# Patient Record
Sex: Male | Born: 1935 | Race: White | Hispanic: No | Marital: Single | Smoking: Never smoker
Health system: Southern US, Community
[De-identification: ages and names within clinical notes are randomized; demographics above are authoritative.]

## PROBLEM LIST (undated history)

## (undated) DIAGNOSIS — E119 Type 2 diabetes mellitus without complications: Secondary | ICD-10-CM

## (undated) DIAGNOSIS — H9190 Unspecified hearing loss, unspecified ear: Secondary | ICD-10-CM

## (undated) DIAGNOSIS — B029 Zoster without complications: Secondary | ICD-10-CM

## (undated) DIAGNOSIS — I10 Essential (primary) hypertension: Secondary | ICD-10-CM

## (undated) DIAGNOSIS — E7800 Pure hypercholesterolemia, unspecified: Secondary | ICD-10-CM

## (undated) HISTORY — PX: CARDIAC SURGERY: SHX584

## (undated) HISTORY — PX: CARDIAC CATHETERIZATION: SHX172

## (undated) HISTORY — PX: VEIN LIGATION AND STRIPPING: SHX2653

---

## 2007-01-20 HISTORY — PX: CORONARY ARTERY BYPASS GRAFT: SHX141

## 2007-07-13 ENCOUNTER — Inpatient Hospital Stay: Payer: Self-pay | Admitting: Internal Medicine

## 2007-07-13 ENCOUNTER — Other Ambulatory Visit: Payer: Self-pay

## 2007-07-14 ENCOUNTER — Other Ambulatory Visit: Payer: Self-pay

## 2007-08-03 ENCOUNTER — Encounter: Payer: Self-pay | Admitting: Internal Medicine

## 2007-08-20 ENCOUNTER — Encounter: Payer: Self-pay | Admitting: Internal Medicine

## 2007-10-05 ENCOUNTER — Encounter: Payer: Self-pay | Admitting: Cardiology

## 2007-10-20 ENCOUNTER — Encounter: Payer: Self-pay | Admitting: Cardiology

## 2007-11-20 ENCOUNTER — Encounter: Payer: Self-pay | Admitting: Cardiology

## 2007-12-20 ENCOUNTER — Encounter: Payer: Self-pay | Admitting: Cardiology

## 2009-02-17 IMAGING — CR DG CHEST 1V PORT
1 series · 1 of 1 positions shown · non-contrast
Comparison: none

REASON FOR EXAM: chest pain
COMMENTS:

PROCEDURE:     DXR - DXR PORTABLE CHEST SINGLE VIEW  - July 13, 2007 [DATE]
RESULT:     The lung fields are clear. No pneumonia, pneumothorax or pleural
effusion is seen. The heart size is within the limits of normal.  There is a
mild thoracolumbar scoliosis.

[view not recorded]
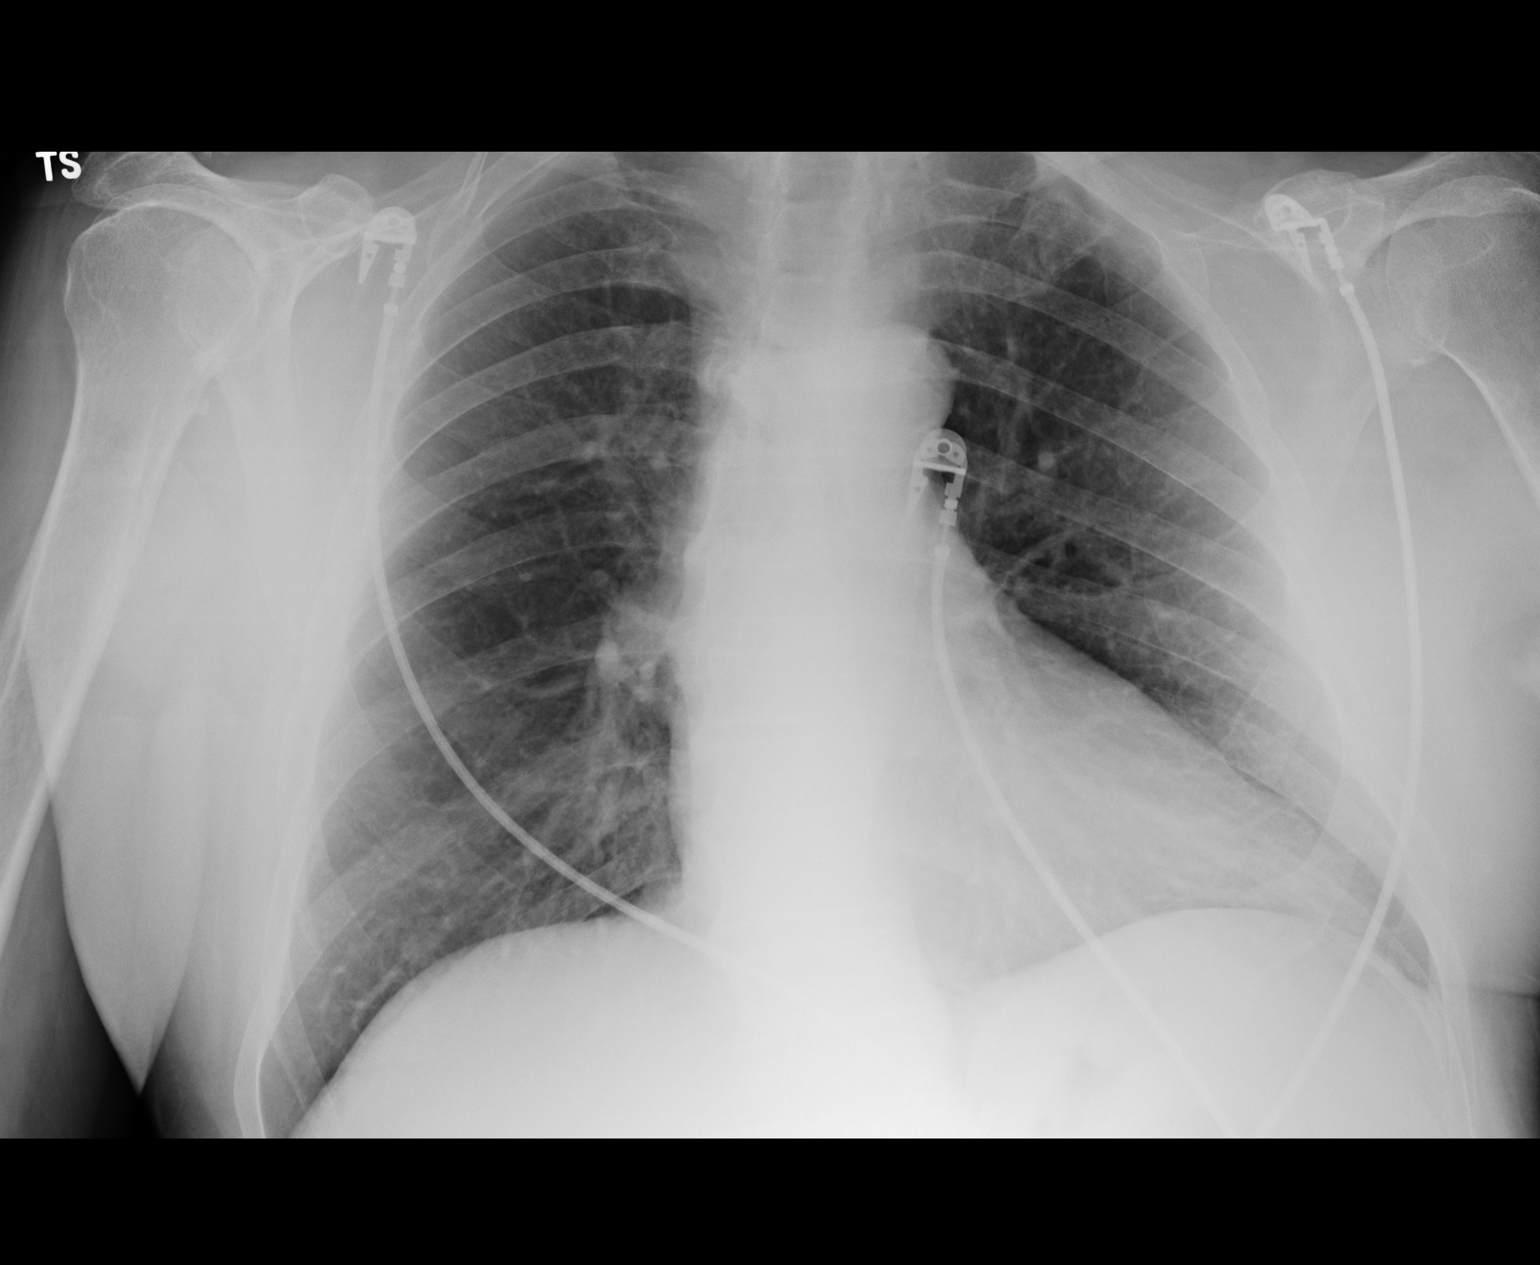

[1 of 1 positions shown; findings below may reference images not displayed]

IMPRESSION: No acute changes are identified.

## 2009-02-19 IMAGING — US US CAROTID DUPLEX BILAT
1 series · 17 of 24 positions shown · non-contrast
Comparison: none

REASON FOR EXAM: precabg
COMMENTS:

[Series 1: us carotid duplex bilat · 17 of 62 slices shown]
[im 1/62]
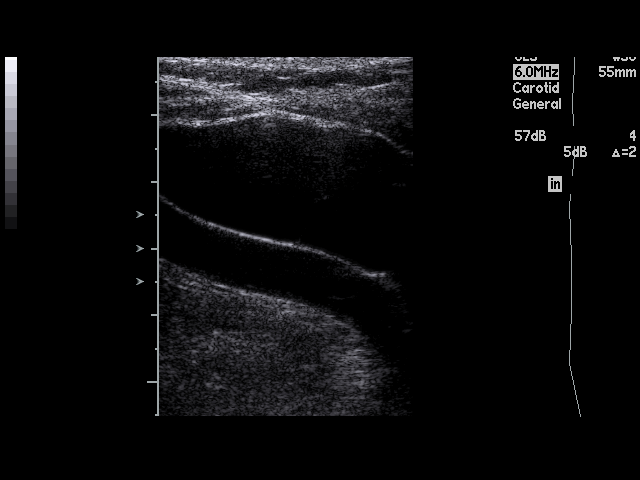
[im 6/62]
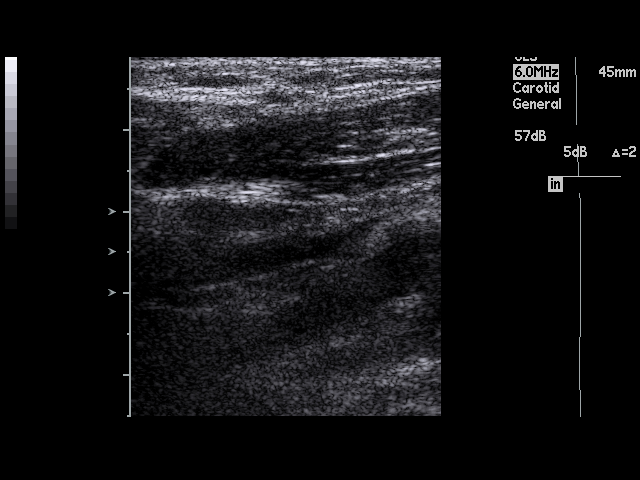
[im 8/62]
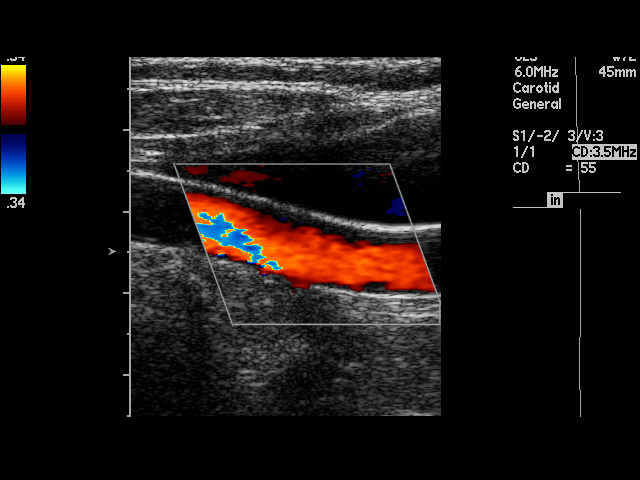
[im 11/62]
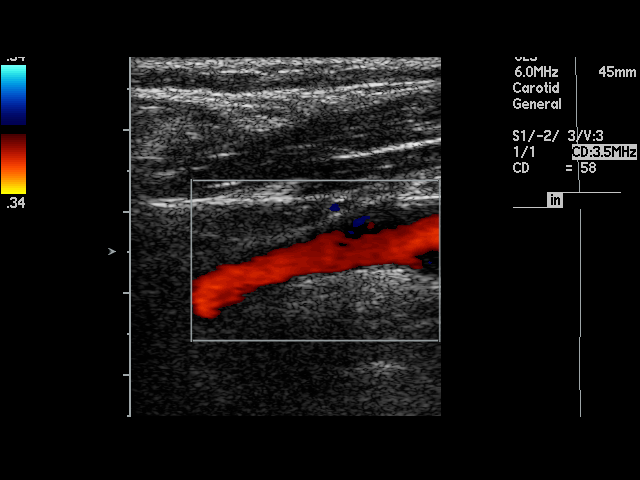
[im 16/62]
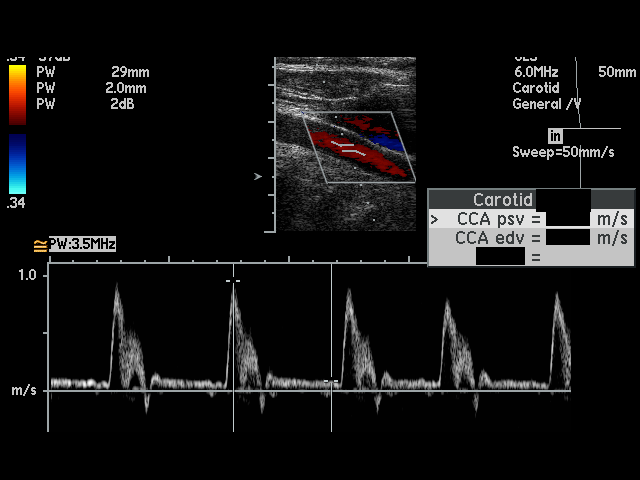
[im 19/62]
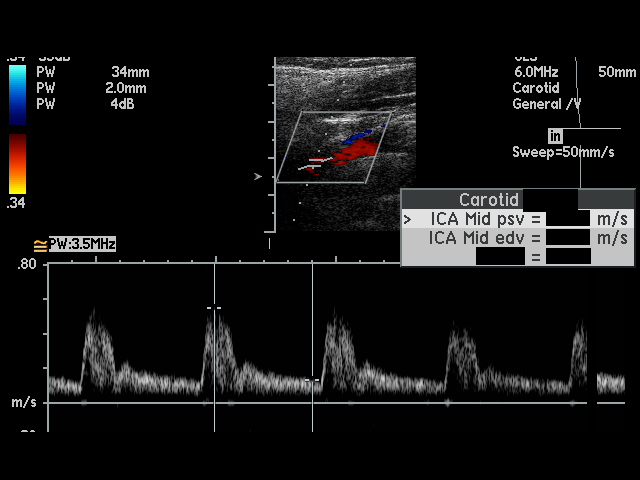
[im 24/62]
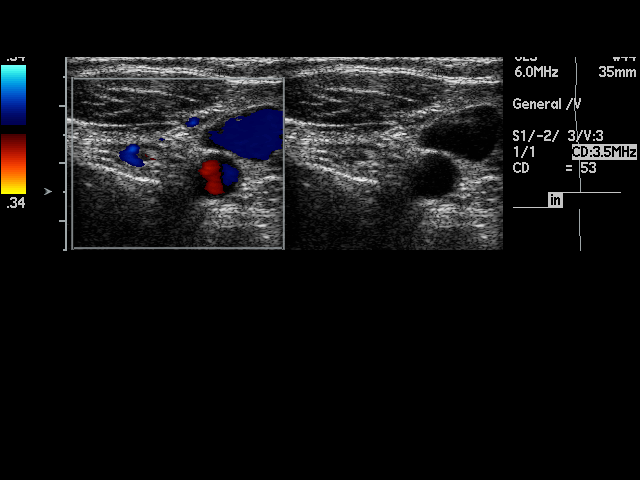
[im 27/62]
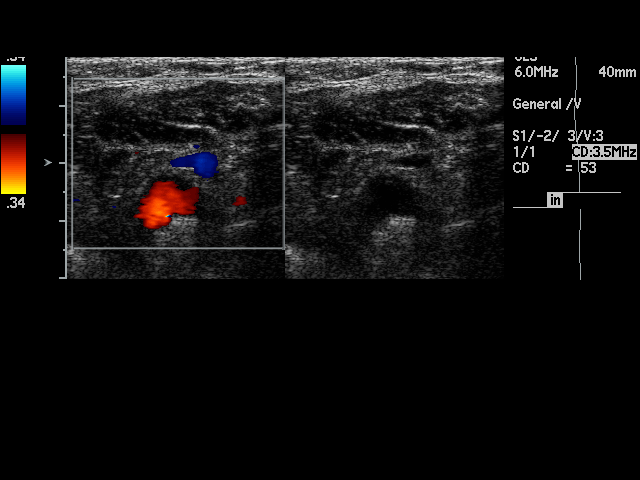
[im 32/62]
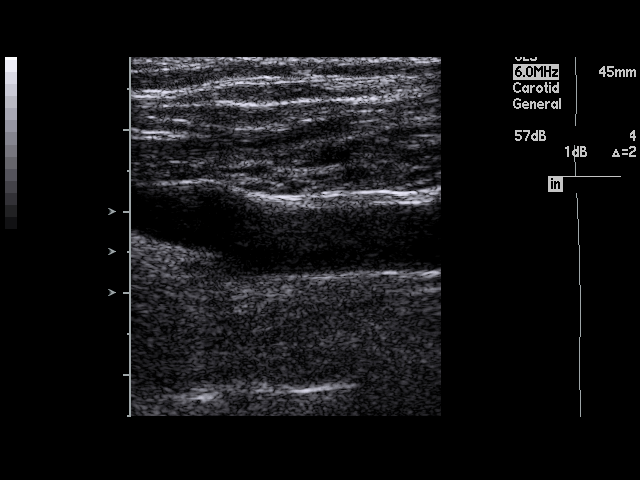
[im 35/62]
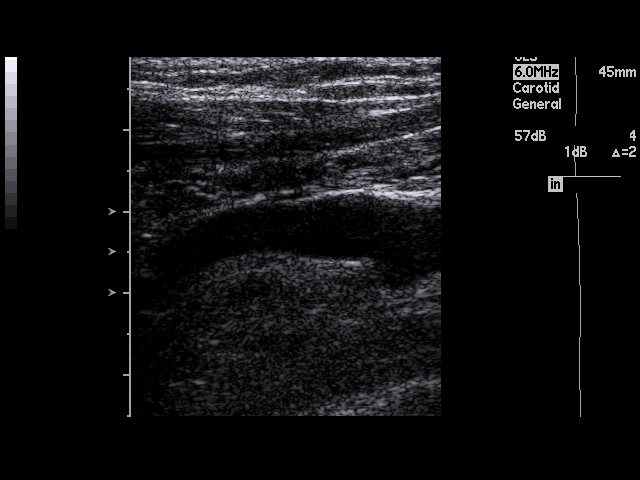
[im 38/62]
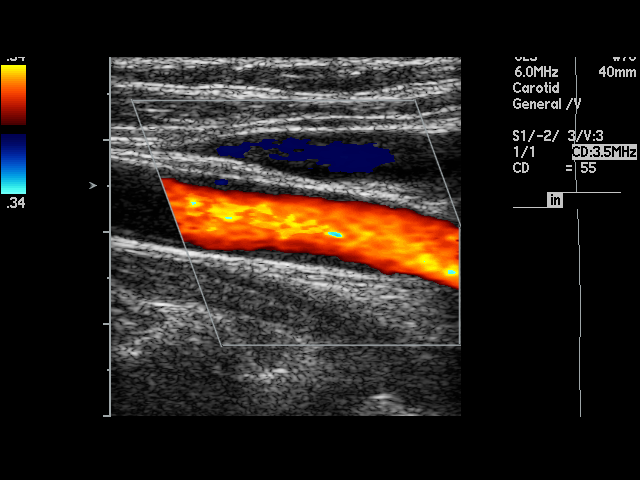
[im 43/62]
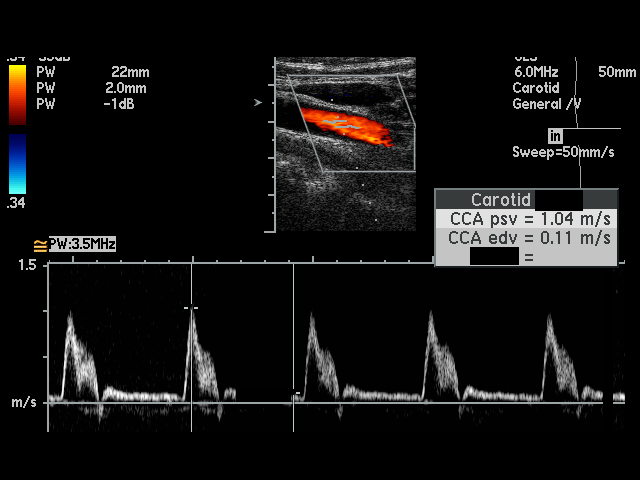
[im 46/62]
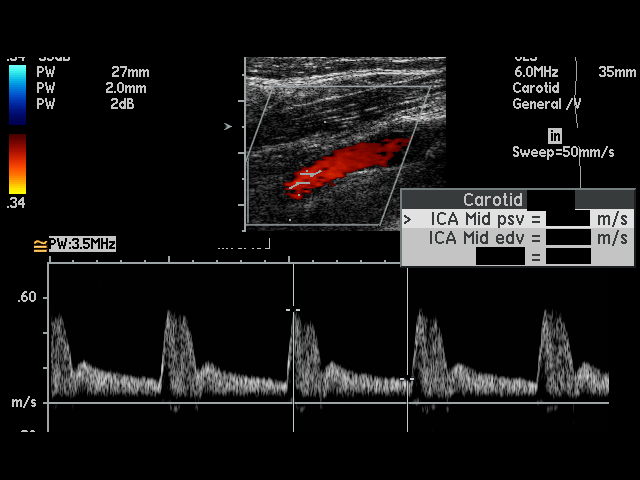
[im 51/62]
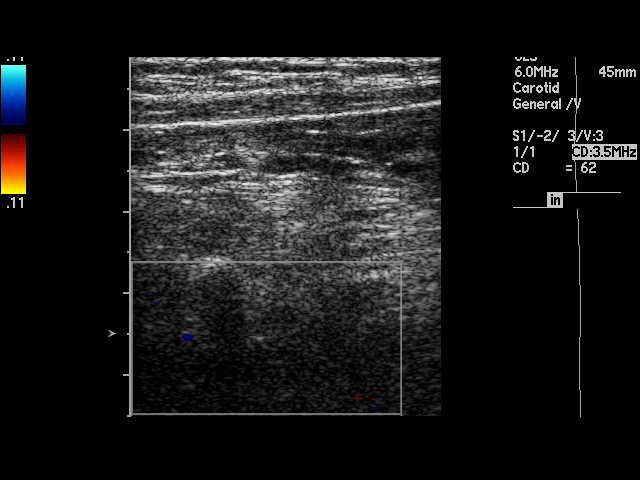
[im 54/62]
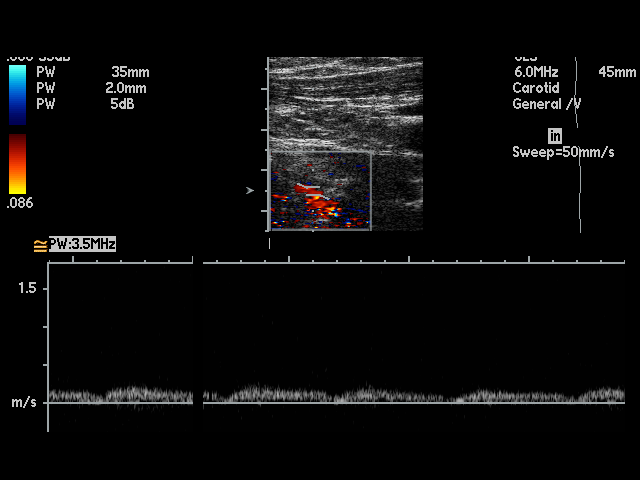
[im 56/62]
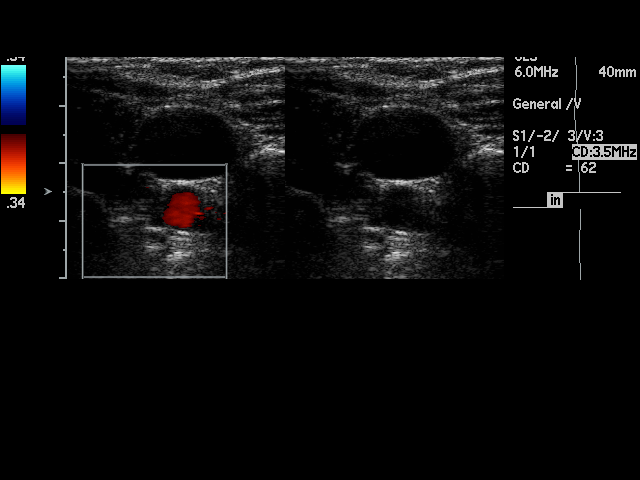
[im 62/62]
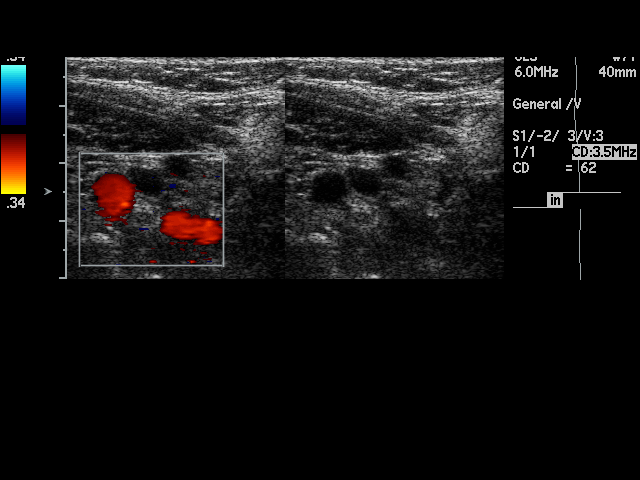

[17 of 24 positions shown; findings below may reference images not displayed]

PROCEDURE:     US  - US CAROTID DOPPLER BILATERAL  - July 15, 2007  [DATE]

RESULT:     No definite plaque formation is identified on either side. On
the RIGHT, the peak RIGHT common carotid artery flow velocity measures .964
meters per second and the peak RIGHT internal carotid artery flow velocity
measures 0.697 meters per second. The ICA/CCA ratio is 0.723. On the LEFT,
the peak LEFT common carotid artery flow velocity measures 1.04 meters per
second and the peak LEFT internal carotid artery flow velocity measures
0.534 meters per second. The ICA/CCA ratio is 0.513.  These values
bilaterally are in the normal range and are consistent with the bilateral
absence of hemodynamically significant stenosis.

There is observed antegrade flow in the RIGHT vertebral. The LEFT vertebral
is not seen and apparently is occluded or hypoplastic.
IMPRESSION: 1. No plaque formation or stenosis is identified on either side.
2. There is antegrade flow in the RIGHT vertebral.
3. The LEFT vertebral is not seen and apparently is occluded or hypoplastic.

## 2013-05-24 DIAGNOSIS — I25810 Atherosclerosis of coronary artery bypass graft(s) without angina pectoris: Secondary | ICD-10-CM | POA: Insufficient documentation

## 2018-09-30 ENCOUNTER — Emergency Department
Admission: EM | Admit: 2018-09-30 | Discharge: 2018-09-30 | Disposition: A | Discharge status: Home / Self Care | Payer: Medicare Other | Attending: Student in an Organized Health Care Education/Training Program | Admitting: Student in an Organized Health Care Education/Training Program

## 2018-09-30 ENCOUNTER — Other Ambulatory Visit: Payer: Self-pay

## 2018-09-30 DIAGNOSIS — Z79899 Other long term (current) drug therapy: Secondary | ICD-10-CM | POA: Insufficient documentation

## 2018-09-30 DIAGNOSIS — R739 Hyperglycemia, unspecified: Secondary | ICD-10-CM

## 2018-09-30 DIAGNOSIS — Z7982 Long term (current) use of aspirin: Secondary | ICD-10-CM | POA: Insufficient documentation

## 2018-09-30 DIAGNOSIS — Z7984 Long term (current) use of oral hypoglycemic drugs: Secondary | ICD-10-CM | POA: Diagnosis not present

## 2018-09-30 DIAGNOSIS — E1165 Type 2 diabetes mellitus with hyperglycemia: Secondary | ICD-10-CM | POA: Insufficient documentation

## 2018-09-30 HISTORY — DX: Type 2 diabetes mellitus without complications: E11.9

## 2018-09-30 LAB — URINALYSIS, COMPLETE (UACMP) WITH MICROSCOPIC
Bacteria, UA: NONE SEEN
Bilirubin Urine: NEGATIVE
Glucose, UA: >=500 mg/dL — AB
Hgb urine dipstick: NEGATIVE
Ketones, ur: NEGATIVE mg/dL
Leukocytes,Ua: NEGATIVE
Nitrite: NEGATIVE
Protein, ur: NEGATIVE mg/dL
Specific Gravity, Urine: 1.02 (ref 1.005–1.030)
Squamous Epithelial / HPF: NONE SEEN (ref 0–5)
WBC, UA: NONE SEEN WBC/hpf (ref 0–5)
pH: 6 (ref 5.0–8.0)

## 2018-09-30 LAB — BASIC METABOLIC PANEL
Anion gap: 11 (ref 5–15)
BUN: 18 mg/dL (ref 8–23)
CO2: 22 mmol/L (ref 22–32)
Calcium: 9.1 mg/dL (ref 8.9–10.3)
Chloride: 98 mmol/L (ref 98–111)
Creatinine, Ser: 1.24 mg/dL (ref 0.61–1.24)
GFR calc Af Amer: 58 mL/min — ABNORMAL LOW (ref >60)
GFR calc non Af Amer: 50 mL/min — ABNORMAL LOW (ref >60)
Glucose, Bld: 504 mg/dL (ref 70–99)
Potassium: 4.8 mmol/L (ref 3.5–5.1)
Sodium: 131 mmol/L — ABNORMAL LOW (ref 135–145)

## 2018-09-30 LAB — CBC
HCT: 41.8 % (ref 39.0–52.0)
Hemoglobin: 14.5 g/dL (ref 13.0–17.0)
MCH: 29.8 pg (ref 26.0–34.0)
MCHC: 34.7 g/dL (ref 30.0–36.0)
MCV: 86 fL (ref 80.0–100.0)
Platelets: 196 10*3/uL (ref 150–400)
RBC: 4.86 MIL/uL (ref 4.22–5.81)
RDW: 11.9 % (ref 11.5–15.5)
WBC: 8.9 10*3/uL (ref 4.0–10.5)
nRBC: 0 % (ref 0.0–0.2)

## 2018-09-30 LAB — GLUCOSE, CAPILLARY: Glucose-Capillary: 324 mg/dL — ABNORMAL HIGH (ref 70–99)

## 2018-09-30 MED ORDER — METFORMIN HCL 500 MG PO TABS: 500.0000 mg | ORAL_TABLET | Freq: Every day | ORAL | 11 refills | Status: DC

## 2018-09-30 MED ORDER — SODIUM CHLORIDE 0.9 % IV SOLN
Freq: Once | INTRAVENOUS | Status: AC
  Administered 2018-09-30: 14:00:00 1000 mL via INTRAVENOUS

## 2018-09-30 MED ORDER — SODIUM CHLORIDE 0.9 % IV BOLUS
250.0000 mL | Freq: Once | INTRAVENOUS | Status: AC
  Administered 2018-09-30: 250 mL via INTRAVENOUS

## 2018-09-30 MED ORDER — INSULIN ASPART 100 UNIT/ML ~~LOC~~ SOLN
5.0000 [IU] | Freq: Once | SUBCUTANEOUS | Status: AC
  Administered 2018-09-30: 5 [IU] via INTRAVENOUS
  Filled 2018-09-30: qty 1

## 2018-09-30 NOTE — ED Triage Notes (Signed)
Reports hyperglycemia, checked at doctor's office. Pt states he does not regularly heck his blood sugar, does not take insulin. Pt denies any complaints. Pt alert and oriented X4, cooperative, RR even and unlabored, color WNL. Pt in NAD.

## 2018-09-30 NOTE — ED Provider Notes (Signed)
[facility] Emergency Department Provider Note    First MD Initiated Contact with Patient 09/30/18 1826     (approximate)  I have reviewed the triage vital signs and the nursing notes.   HISTORY  Chief Complaint Hyperglycemia    HPI Francisco Knapp is a 85 y.o. male presents from clinic due to concern for elevated blood sugar.  Patient denies any history of diabetes.  States is not on any medications.  States he has noted some fatigue and increased urinary frequency over the past several months.  Denies any chest pain, shortness of breath, nausea or vomiting.  No fevers.   Past Medical History:  Diagnosis Date  . Diabetes mellitus without complication (HCC)    No family history on file. Past Surgical History:  Procedure Laterality Date  . CARDIAC SURGERY     There are no active problems to display for this patient.     Prior to Admission medications   Medication Sig Start Date End Date Taking? Authorizing Provider  aspirin 81 MG chewable tablet Chew 81 mg by mouth daily.   Yes [provider]  metoprolol succinate (TOPROL-XL) 25 MG 24 hr tablet Take 25 mg by mouth daily. 03/18/18  Yes [provider]  Multiple Vitamin (MULTIVITAMIN WITH MINERALS) TABS tablet Take 1 tablet by mouth daily.   Yes [provider]  ramipril (ALTACE) 5 MG capsule Take 5 mg by mouth daily. 03/18/18  Yes [provider]  simvastatin (ZOCOR) 40 MG tablet Take 40 mg by mouth Nightly. 03/18/18  Yes [provider]  metFORMIN (GLUCOPHAGE) 500 MG tablet Take 1 tablet (500 mg total) by mouth daily with breakfast. 09/30/18 09/30/19  Willy Eddyobinson, Vontae Court, MD    Allergies Patient has no known allergies.    Social History Social History   Tobacco Use  . Smoking status: Never Smoker  . Smokeless tobacco: Never Used  Substance Use Topics  . Alcohol use: Never    Frequency: Never  . Drug use: Not on file    Review of Systems  Patient denies headaches, rhinorrhea, blurry vision, numbness, shortness of breath, chest pain, edema, cough, abdominal pain, nausea, vomiting, diarrhea, dysuria, fevers, rashes or hallucinations unless otherwise stated above in HPI. ____________________________________________   PHYSICAL EXAM:  VITAL SIGNS: Vitals:   09/30/18 1415 09/30/18 1920  BP: (!) 167/82 (!) 174/83  Pulse: 86 74  Resp: 18 18  Temp: 98.5 F (36.9 C)   SpO2: 98% 99%    Constitutional: Alert and oriented.  Eyes: Conjunctivae are normal.  Head: Atraumatic. Nose: No congestion/rhinnorhea. Mouth/Throat: Mucous membranes are moist.   Neck: No stridor. Painless ROM.  Cardiovascular: Normal rate, regular rhythm. Grossly normal heart sounds.  Good peripheral circulation. Respiratory: Normal respiratory effort.  No retractions. Lungs CTAB. Gastrointestinal: Soft and nontender. No distention. No abdominal bruits. No CVA tenderness. Genitourinary:  Musculoskeletal: No lower extremity tenderness nor edema.  No joint effusions. Neurologic:  Normal speech and language. No gross focal neurologic deficits are appreciated. No facial droop Skin:  Skin is warm, dry and intact. No rash noted. Psychiatric: Mood and affect are normal. Speech and behavior are normal.  ____________________________________________   LABS (all labs ordered are listed, but only abnormal results are displayed)  Results for orders placed or performed during the hospital encounter of 09/30/18 (from the past 24 hour(s))  Basic metabolic panel     Status: Abnormal   Collection Time: 09/30/18  2:17 PM  Result Value Ref Range  Sodium 131 (L) 135 - 145 mmol/L   Potassium 4.8 3.5 - 5.1 mmol/L   Chloride 98 98 - 111 mmol/L   CO2 22 22 - 32 mmol/L   Glucose, Bld 504 (HH) 70 - 99 mg/dL   BUN 18 8 - 23 mg/dL   Creatinine, Ser 1.24 0.61 - 1.24 mg/dL   Calcium 9.1 8.9 - 10.3 mg/dL   GFR calc non Af Amer 50 (L) >60 mL/min   GFR calc Af Amer 58 (L) >60  mL/min   Anion gap 11 5 - 15  CBC     Status: None   Collection Time: 09/30/18  2:17 PM  Result Value Ref Range   WBC 8.9 4.0 - 10.5 K/uL   RBC 4.86 4.22 - 5.81 MIL/uL   Hemoglobin 14.5 13.0 - 17.0 g/dL   HCT 41.8 39.0 - 52.0 %   MCV 86.0 80.0 - 100.0 fL   MCH 29.8 26.0 - 34.0 pg   MCHC 34.7 30.0 - 36.0 g/dL   RDW 11.9 11.5 - 15.5 %   Platelets 196 150 - 400 K/uL   nRBC 0.0 0.0 - 0.2 %  Glucose, capillary     Status: Abnormal   Collection Time: 09/30/18  6:57 PM  Result Value Ref Range   Glucose-Capillary 324 (H) 70 - 99 mg/dL  Urinalysis, Complete w Microscopic     Status: Abnormal   Collection Time: 09/30/18  8:28 PM  Result Value Ref Range   Color, Urine STRAW (A) YELLOW   APPearance CLEAR (A) CLEAR   Specific Gravity, Urine 1.020 1.005 - 1.030   pH 6.0 5.0 - 8.0   Glucose, UA >=500 (A) NEGATIVE mg/dL   Hgb urine dipstick NEGATIVE NEGATIVE   Bilirubin Urine NEGATIVE NEGATIVE   Ketones, ur NEGATIVE NEGATIVE mg/dL   Protein, ur NEGATIVE NEGATIVE mg/dL   Nitrite NEGATIVE NEGATIVE   Leukocytes,Ua NEGATIVE NEGATIVE   RBC / HPF 0-5 0 - 5 RBC/hpf   WBC, UA NONE SEEN 0 - 5 WBC/hpf   Bacteria, UA NONE SEEN NONE SEEN   Squamous Epithelial / LPF NONE SEEN 0 - 5   Mucus PRESENT    ____________________________________________ ____________________________________________  RADIOLOGY   ____________________________________________   PROCEDURES  Procedure(s) performed:  Procedures    Critical Care performed: no ____________________________________________   INITIAL IMPRESSION / ASSESSMENT AND PLAN / ED COURSE  Pertinent labs & imaging results that were available during my care of the patient were reviewed by me and considered in my medical decision making (see chart for details).   DDX: Hyperglycemia, DKA, HHS, AKI  Francisco Knapp is a 85 y.o. who presents to the ED with symptoms as described above.  Patient well-appearing nontoxic.  Blood work sent for the blood  differential does show evidence of hyperglycemia without any evidence of acidosis or elevated anion gap.  Will give IV fluids as well as a dose of insulin.     Patient's blood work is reassuring.  Does not have any evidence of acidosis or ketones in his urine.  No evidence of infection.  Per current recommendations will start patient on metformin for hyperglycemia control.  Will be given her outpatient follow-up.  Denies any indication for hospitalization at this time.  Patient well-appearing in no acute distress.  Have discussed with the patient and available family all diagnostics and treatments performed thus far and all questions were answered to the best of my ability. The patient demonstrates understanding and agreement with plan.   The  patient was evaluated in Emergency Department today for the symptoms described in the history of present illness. He/she was evaluated in the context of the global COVID-19 pandemic, which necessitated consideration that the patient might be at risk for infection with the SARS-CoV-2 virus that causes COVID-19. Institutional protocols and algorithms that pertain to the evaluation of patients at risk for COVID-19 are in a state of rapid change based on information released by regulatory bodies including the CDC and federal and state organizations. These policies and algorithms were followed during the patient's care in the ED.  As part of my medical decision making, I reviewed the following data within the electronic MEDICAL RECORD NUMBER Nursing notes reviewed and incorporated, Labs reviewed, notes from prior ED visits and [place] Controlled Substance Database   ____________________________________________   FINAL CLINICAL IMPRESSION(S) / ED DIAGNOSES  Final diagnoses:  Hyperglycemia      NEW MEDICATIONS STARTED DURING THIS VISIT:  New Prescriptions   METFORMIN (GLUCOPHAGE) 500 MG TABLET    Take 1 tablet (500 mg total) by mouth daily with breakfast.     Note:   This document was prepared using Dragon voice recognition software and may include unintentional dictation errors.    Willy Eddy, MD 09/30/18 2132

## 2018-10-03 LAB — GLUCOSE, CAPILLARY: Glucose-Capillary: 513 mg/dL (ref 70–99)

## 2019-12-07 ENCOUNTER — Other Ambulatory Visit: Payer: Self-pay

## 2019-12-07 ENCOUNTER — Encounter: Payer: Self-pay | Admitting: Ophthalmology

## 2019-12-12 NOTE — Discharge Instructions (Signed)

## 2019-12-15 ENCOUNTER — Other Ambulatory Visit: Payer: Self-pay

## 2019-12-15 ENCOUNTER — Other Ambulatory Visit
Admission: RE | Admit: 2019-12-15 | Discharge: 2019-12-15 | Disposition: A | Discharge status: Home / Self Care | Payer: Medicare Other | Source: Ambulatory Visit | Attending: Ophthalmology | Admitting: Ophthalmology

## 2019-12-15 DIAGNOSIS — Z20822 Contact with and (suspected) exposure to covid-19: Secondary | ICD-10-CM | POA: Insufficient documentation

## 2019-12-15 DIAGNOSIS — Z01812 Encounter for preprocedural laboratory examination: Secondary | ICD-10-CM | POA: Diagnosis present

## 2019-12-16 LAB — SARS CORONAVIRUS 2 (TAT 6-24 HRS): SARS Coronavirus 2: NEGATIVE

## 2020-01-16 ENCOUNTER — Encounter: Payer: Self-pay | Admitting: Ophthalmology

## 2020-01-16 ENCOUNTER — Other Ambulatory Visit: Payer: Self-pay

## 2020-01-25 ENCOUNTER — Other Ambulatory Visit
Admission: RE | Admit: 2020-01-25 | Discharge: 2020-01-25 | Disposition: A | Discharge status: Home / Self Care | Payer: Medicare Other | Source: Ambulatory Visit | Attending: Ophthalmology | Admitting: Ophthalmology

## 2020-01-25 ENCOUNTER — Other Ambulatory Visit: Payer: Self-pay

## 2020-01-25 DIAGNOSIS — Z01812 Encounter for preprocedural laboratory examination: Secondary | ICD-10-CM | POA: Insufficient documentation

## 2020-01-25 DIAGNOSIS — Z20822 Contact with and (suspected) exposure to covid-19: Secondary | ICD-10-CM | POA: Insufficient documentation

## 2020-01-25 LAB — SARS CORONAVIRUS 2 (TAT 6-24 HRS): SARS Coronavirus 2: NEGATIVE

## 2020-01-29 ENCOUNTER — Ambulatory Visit: Payer: Medicare Other | Admitting: Anesthesiology

## 2020-01-29 ENCOUNTER — Encounter
Admission: RE | Disposition: A | Discharge status: Home / Self Care | Payer: Self-pay | Source: Home / Self Care | Attending: Ophthalmology

## 2020-01-29 ENCOUNTER — Other Ambulatory Visit: Payer: Self-pay

## 2020-01-29 ENCOUNTER — Ambulatory Visit
Admission: RE | Admit: 2020-01-29 | Discharge: 2020-01-29 | Disposition: A | Discharge status: Home / Self Care | Payer: Medicare Other | Attending: Ophthalmology | Admitting: Ophthalmology

## 2020-01-29 DIAGNOSIS — Z951 Presence of aortocoronary bypass graft: Secondary | ICD-10-CM | POA: Insufficient documentation

## 2020-01-29 DIAGNOSIS — H2512 Age-related nuclear cataract, left eye: Secondary | ICD-10-CM | POA: Diagnosis not present

## 2020-01-29 DIAGNOSIS — Z7984 Long term (current) use of oral hypoglycemic drugs: Secondary | ICD-10-CM | POA: Insufficient documentation

## 2020-01-29 DIAGNOSIS — Z7982 Long term (current) use of aspirin: Secondary | ICD-10-CM | POA: Insufficient documentation

## 2020-01-29 DIAGNOSIS — E1136 Type 2 diabetes mellitus with diabetic cataract: Secondary | ICD-10-CM | POA: Diagnosis present

## 2020-01-29 DIAGNOSIS — Z79899 Other long term (current) drug therapy: Secondary | ICD-10-CM | POA: Insufficient documentation

## 2020-01-29 HISTORY — DX: Pure hypercholesterolemia, unspecified: E78.00

## 2020-01-29 HISTORY — DX: Essential (primary) hypertension: I10

## 2020-01-29 HISTORY — DX: Unspecified hearing loss, unspecified ear: H91.90

## 2020-01-29 HISTORY — PX: CATARACT EXTRACTION W/PHACO: SHX586

## 2020-01-29 HISTORY — DX: Zoster without complications: B02.9

## 2020-01-29 LAB — GLUCOSE, CAPILLARY
Glucose-Capillary: 124 mg/dL — ABNORMAL HIGH (ref 70–99)
Glucose-Capillary: 112 mg/dL — ABNORMAL HIGH (ref 70–99)

## 2020-01-29 SURGERY — PHACOEMULSIFICATION, CATARACT, WITH IOL INSERTION
Anesthesia: Monitor Anesthesia Care | Site: Eye | Laterality: Left

## 2020-01-29 MED ORDER — SODIUM HYALURONATE 23 MG/ML IO SOLN
INTRAOCULAR | Status: DC | PRN
  Administered 2020-01-29: 0.6 mL via INTRAOCULAR

## 2020-01-29 MED ORDER — ACETAMINOPHEN 325 MG PO TABS: 325.0000 mg | ORAL_TABLET | Freq: Once | ORAL | Status: DC

## 2020-01-29 MED ORDER — FENTANYL CITRATE (PF) 100 MCG/2ML IJ SOLN
INTRAMUSCULAR | Status: DC | PRN
  Administered 2020-01-29: 50 ug via INTRAVENOUS

## 2020-01-29 MED ORDER — ACETAMINOPHEN 325 MG PO TABS: 325.0000 mg | ORAL_TABLET | ORAL | Status: DC | PRN

## 2020-01-29 MED ORDER — SODIUM HYALURONATE 10 MG/ML IO SOLN
INTRAOCULAR | Status: DC | PRN
  Administered 2020-01-29: 0.55 mL via INTRAOCULAR

## 2020-01-29 MED ORDER — ACETAMINOPHEN 160 MG/5ML PO SOLN: 325.0000 mg | ORAL | Status: DC | PRN

## 2020-01-29 MED ORDER — MOXIFLOXACIN HCL 0.5 % OP SOLN
OPHTHALMIC | Status: DC | PRN
  Administered 2020-01-29: 0.2 mL via OPHTHALMIC

## 2020-01-29 MED ORDER — ACETAMINOPHEN 160 MG/5ML PO SOLN: 325.0000 mg | Freq: Once | ORAL | Status: DC

## 2020-01-29 MED ORDER — ONDANSETRON HCL 4 MG/2ML IJ SOLN: 4.0000 mg | Freq: Once | INTRAMUSCULAR | Status: DC | PRN

## 2020-01-29 MED ORDER — ARMC OPHTHALMIC DILATING DROPS
1.0000 "application " | OPHTHALMIC | Status: DC | PRN
  Administered 2020-01-29 (×3): 1 via OPHTHALMIC

## 2020-01-29 MED ORDER — EPINEPHRINE PF 1 MG/ML IJ SOLN
INTRAOCULAR | Status: DC | PRN
  Administered 2020-01-29: 93 mL via OPHTHALMIC

## 2020-01-29 MED ORDER — TETRACAINE HCL 0.5 % OP SOLN
1.0000 [drp] | OPHTHALMIC | Status: DC | PRN
  Administered 2020-01-29 (×3): 1 [drp] via OPHTHALMIC

## 2020-01-29 MED ORDER — LIDOCAINE HCL (PF) 2 % IJ SOLN
INTRAOCULAR | Status: DC | PRN
  Administered 2020-01-29: 1 mL via INTRAOCULAR

## 2020-01-29 MED ORDER — LACTATED RINGERS IV SOLN: INTRAVENOUS | Status: DC

## 2020-01-29 SURGICAL SUPPLY — 17 items
CANNULA ANT/CHMB 27GA (MISCELLANEOUS) ×4 IMPLANT
DISSECTOR HYDRO NUCLEUS 50X22 (MISCELLANEOUS) ×2 IMPLANT
GLOVE SURG LX 7.5 STRW (GLOVE) ×2
GLOVE SURG LX STRL 7.5 STRW (GLOVE) ×2 IMPLANT
GLOVE SURG SYN 8.5  E (GLOVE) ×1
GLOVE SURG SYN 8.5 E (GLOVE) ×1 IMPLANT
GOWN STRL REUS W/ TWL LRG LVL3 (GOWN DISPOSABLE) ×2 IMPLANT
GOWN STRL REUS W/TWL LRG LVL3 (GOWN DISPOSABLE) ×4
LENS IOL TECNIS EYHANCE 21.5 (Intraocular Lens) ×2 IMPLANT
MARKER SKIN DUAL TIP RULER LAB (MISCELLANEOUS) ×2 IMPLANT
PACK DR. KING ARMS (PACKS) ×2 IMPLANT
PACK EYE AFTER SURG (MISCELLANEOUS) ×2 IMPLANT
PACK OPTHALMIC (MISCELLANEOUS) ×2 IMPLANT
SYR 3ML LL SCALE MARK (SYRINGE) ×2 IMPLANT
SYR TB 1ML LUER SLIP (SYRINGE) ×2 IMPLANT
WATER STERILE IRR 250ML POUR (IV SOLUTION) ×2 IMPLANT
WIPE NON LINTING 3.25X3.25 (MISCELLANEOUS) ×2 IMPLANT

## 2020-01-29 NOTE — Anesthesia Preprocedure Evaluation (Signed)
Anesthesia Evaluation  Patient identified by MRN, date of birth, ID band Patient awake    Reviewed: Allergy & Precautions, H&P , NPO status , Patient's Chart, lab work & pertinent test results  Airway Mallampati: II  TM Distance: >3 FB Neck ROM: full    Dental no notable dental hx.    Pulmonary    Pulmonary exam normal breath sounds clear to auscultation       Cardiovascular hypertension, Normal cardiovascular exam Rhythm:regular Rate:Normal     Neuro/Psych    GI/Hepatic   Endo/Other  diabetes, Type 2  Renal/GU      Musculoskeletal   Abdominal   Peds  Hematology   Anesthesia Other Findings   Reproductive/Obstetrics                             Anesthesia Physical Anesthesia Plan  ASA: II  Anesthesia Plan: MAC   Post-op Pain Management:    Induction:   PONV Risk Score and Plan: 1 and Treatment may vary due to age or medical condition, Midazolam and TIVA  Airway Management Planned:   Additional Equipment:   Intra-op Plan:   Post-operative Plan:   Informed Consent: I have reviewed the patients History and Physical, chart, labs and discussed the procedure including the risks, benefits and alternatives for the proposed anesthesia with the patient or authorized representative who has indicated his/her understanding and acceptance.     Dental Advisory Given  Plan Discussed with: CRNA  Anesthesia Plan Comments:         Anesthesia Quick Evaluation

## 2020-01-29 NOTE — H&P (Signed)
[facility]   Primary Care Physician:  Mick Sell, MD Ophthalmologist: Dr. Willey Blade  Pre-Procedure History & Physical: HPI:  Francisco Knapp is a 87 y.o. male here for cataract surgery.   Past Medical History:  Diagnosis Date  . Diabetes mellitus without complication (HCC)    Type 2  . HOH (hard of hearing)   . Hypercholesteremia   . Hypertension   . Shingles    HX of    Past Surgical History:  Procedure Laterality Date  . CARDIAC CATHETERIZATION    . CARDIAC SURGERY    . CORONARY ARTERY BYPASS GRAFT  2009   x4  . VEIN LIGATION AND STRIPPING      Prior to Admission medications   Medication Sig Start Date End Date Taking? Authorizing Provider  aspirin 81 MG chewable tablet Chew 81 mg by mouth daily.   Yes [provider]  cetirizine (ZYRTEC) 10 MG chewable tablet Chew 10 mg by mouth daily.   Yes [provider]  metoprolol succinate (TOPROL-XL) 25 MG 24 hr tablet Take 25 mg by mouth daily. 03/18/18  Yes [provider]  Multiple Vitamin (MULTIVITAMIN WITH MINERALS) TABS tablet Take 1 tablet by mouth daily.   Yes [provider]  ramipril (ALTACE) 5 MG capsule Take 5 mg by mouth daily. 03/18/18  Yes [provider]  simvastatin (ZOCOR) 40 MG tablet Take 40 mg by mouth Nightly. 03/18/18  Yes [provider]  metFORMIN (GLUCOPHAGE) 500 MG tablet Take 1 tablet (500 mg total) by mouth daily with breakfast. 09/30/18 09/30/19  Willy Eddy, MD    Allergies as of 11/17/2019  . (No Known Allergies)    History reviewed. No pertinent family history.  Social History   Socioeconomic History  . Marital status: Single    Spouse name: Not on file  . Number of children: Not on file  . Years of education: Not on file  . Highest education level: Not on file  Occupational History  . Not on file  Tobacco Use  . Smoking status: Never Smoker  . Smokeless tobacco: Never Used  Substance and Sexual Activity  .  Alcohol use: Never  . Drug use: Not on file  . Sexual activity: Not on file  Other Topics Concern  . Not on file  Social History Narrative  . Not on file   Social Determinants of Health   Financial Resource Strain: Not on file  Food Insecurity: Not on file  Transportation Needs: Not on file  Physical Activity: Not on file  Stress: Not on file  Social Connections: Not on file  Intimate Partner Violence: Not on file    Review of Systems: See HPI, otherwise negative ROS  Physical Exam: BP (!) 191/95 Comment: BP rechecked 177/88  Pulse 77   Temp 97.8 F (36.6 C)   Resp 18   Ht 5\' 11"  (1.803 m)   Wt 73.9 kg   SpO2 99%   BMI 22.73 kg/m  General:   Alert,  pleasant and cooperative in NAD Head:  Normocephalic and atraumatic. Respiratory:  Normal work of breathing.  Impression/Plan: Francisco Knapp is here for cataract surgery.  Risks, benefits, limitations, and alternatives regarding cataract surgery have been reviewed with the patient.  Questions have been answered.  All parties agreeable.   Leslie Dales, MD  01/29/2020, 8:56 AM'

## 2020-01-29 NOTE — Anesthesia Postprocedure Evaluation (Signed)
Anesthesia Post Note  Patient: Francisco Knapp  Procedure(s) Performed: CATARACT EXTRACTION PHACO AND INTRAOCULAR LENS PLACEMENT (IOC) LEFT DIABETIC (Left Eye)     Patient location during evaluation: PACU Anesthesia Type: MAC Level of consciousness: awake and alert and oriented Pain management: satisfactory to patient Vital Signs Assessment: post-procedure vital signs reviewed and stable Respiratory status: spontaneous breathing, nonlabored ventilation and respiratory function stable Cardiovascular status: blood pressure returned to baseline and stable Postop Assessment: Adequate PO intake and No signs of nausea or vomiting Anesthetic complications: no   No complications documented.  Raliegh Ip

## 2020-01-29 NOTE — Op Note (Signed)
OPERATIVE NOTE  Francisco Knapp [id number] 01/29/2020   PREOPERATIVE DIAGNOSIS:  Nuclear sclerotic cataract left eye.  H25.12   POSTOPERATIVE DIAGNOSIS:    Nuclear sclerotic cataract left eye.     PROCEDURE:  Phacoemusification with posterior chamber intraocular lens placement of the left eye   LENS:   Implant Name Type Inv. Item Serial No. Manufacturer Lot No. LRB No. Used Action  DIB00 21.5 Lens IOL   [id number] [employer]  Left 1 Implanted      Procedure(s) with comments: CATARACT EXTRACTION PHACO AND INTRAOCULAR LENS PLACEMENT (IOC) LEFT DIABETIC (Left) - Diabetic - oral meds  DIB00 +21.5   ULTRASOUND TIME: 0 minutes 47 seconds.  CDE 5.27   SURGEON:  Willey Blade, MD, MPH   ANESTHESIA:  Topical with tetracaine drops augmented with 1% preservative-free intracameral lidocaine.  ESTIMATED BLOOD LOSS: <1 mL   COMPLICATIONS:  None.   DESCRIPTION OF PROCEDURE:  The patient was identified in the holding room and transported to the operating room and placed in the supine position under the operating microscope.  The left eye was identified as the operative eye and it was prepped and draped in the usual sterile ophthalmic fashion.   A 1.0 millimeter clear-corneal paracentesis was made at the 5:00 position. 0.5 ml of preservative-free 1% lidocaine with epinephrine was injected into the anterior chamber.  The anterior chamber was filled with Healon 5 viscoelastic.  A 2.4 millimeter keratome was used to make a near-clear corneal incision at the 2:00 position.  A curvilinear capsulorrhexis was made with a cystotome and capsulorrhexis forceps.  Balanced salt solution was used to hydrodissect and hydrodelineate the nucleus.   Phacoemulsification was then used in stop and chop fashion to remove the lens nucleus and epinucleus.  The remaining cortex was then removed using the irrigation and aspiration handpiece. Healon was then placed into the capsular bag to distend it for lens  placement.  A lens was then injected into the capsular bag.  The remaining viscoelastic was aspirated.   Wounds were hydrated with balanced salt solution.  The anterior chamber was inflated to a physiologic pressure with balanced salt solution.  Intracameral vigamox 0.1 mL undiltued was injected into the eye and a drop placed onto the ocular surface.  No wound leaks were noted.  The patient was taken to the recovery room in stable condition without complications of anesthesia or surgery  Willey Blade 01/29/2020, 9:24 AM

## 2020-01-29 NOTE — Transfer of Care (Signed)
Immediate Anesthesia Transfer of Care Note  Patient: Francisco Knapp  Procedure(s) Performed: CATARACT EXTRACTION PHACO AND INTRAOCULAR LENS PLACEMENT (IOC) LEFT DIABETIC (Left Eye)  Patient Location: PACU  Anesthesia Type: MAC  Level of Consciousness: awake, alert  and patient cooperative  Airway and Oxygen Therapy: Patient Spontanous Breathing and Patient connected to supplemental oxygen  Post-op Assessment: Post-op Vital signs reviewed, Patient's Cardiovascular Status Stable, Respiratory Function Stable, Patent Airway and No signs of Nausea or vomiting  Post-op Vital Signs: Reviewed and stable  Complications: No complications documented.

## 2020-01-29 NOTE — Anesthesia Procedure Notes (Signed)
Procedure Name: MAC Date/Time: 01/29/2020 9:04 AM Performed by: Jeannene Patella, CRNA Pre-anesthesia Checklist: Patient identified, Emergency Drugs available, Suction available, Timeout performed and Patient being monitored Patient Re-evaluated:Patient Re-evaluated prior to induction Oxygen Delivery Method: Nasal cannula Placement Confirmation: positive ETCO2

## 2020-01-30 ENCOUNTER — Encounter: Payer: Self-pay | Admitting: Ophthalmology

## 2020-02-06 ENCOUNTER — Encounter: Payer: Self-pay | Admitting: Ophthalmology

## 2020-02-15 ENCOUNTER — Other Ambulatory Visit
Admission: RE | Admit: 2020-02-15 | Discharge: 2020-02-15 | Disposition: A | Discharge status: Home / Self Care | Payer: Medicare Other | Source: Ambulatory Visit | Attending: Ophthalmology | Admitting: Ophthalmology

## 2020-02-15 ENCOUNTER — Other Ambulatory Visit: Payer: Self-pay

## 2020-02-15 DIAGNOSIS — Z01812 Encounter for preprocedural laboratory examination: Secondary | ICD-10-CM | POA: Insufficient documentation

## 2020-02-15 DIAGNOSIS — Z20822 Contact with and (suspected) exposure to covid-19: Secondary | ICD-10-CM | POA: Insufficient documentation

## 2020-02-15 LAB — SARS CORONAVIRUS 2 (TAT 6-24 HRS): SARS Coronavirus 2: NEGATIVE

## 2020-02-15 NOTE — Discharge Instructions (Signed)

## 2020-02-19 ENCOUNTER — Other Ambulatory Visit: Payer: Self-pay

## 2020-02-19 ENCOUNTER — Ambulatory Visit: Payer: Medicare Other | Admitting: Anesthesiology

## 2020-02-19 ENCOUNTER — Ambulatory Visit
Admission: RE | Admit: 2020-02-19 | Discharge: 2020-02-19 | Disposition: A | Discharge status: Home / Self Care | Payer: Medicare Other | Attending: Ophthalmology | Admitting: Ophthalmology

## 2020-02-19 ENCOUNTER — Encounter: Payer: Self-pay | Admitting: Ophthalmology

## 2020-02-19 ENCOUNTER — Encounter
Admission: RE | Disposition: A | Discharge status: Home / Self Care | Payer: Self-pay | Source: Home / Self Care | Attending: Ophthalmology

## 2020-02-19 DIAGNOSIS — Z7982 Long term (current) use of aspirin: Secondary | ICD-10-CM | POA: Insufficient documentation

## 2020-02-19 DIAGNOSIS — H2511 Age-related nuclear cataract, right eye: Secondary | ICD-10-CM | POA: Insufficient documentation

## 2020-02-19 DIAGNOSIS — E1136 Type 2 diabetes mellitus with diabetic cataract: Secondary | ICD-10-CM | POA: Diagnosis not present

## 2020-02-19 DIAGNOSIS — Z7984 Long term (current) use of oral hypoglycemic drugs: Secondary | ICD-10-CM | POA: Insufficient documentation

## 2020-02-19 HISTORY — PX: CATARACT EXTRACTION W/PHACO: SHX586

## 2020-02-19 LAB — GLUCOSE, CAPILLARY
Glucose-Capillary: 117 mg/dL — ABNORMAL HIGH (ref 70–99)
Glucose-Capillary: 120 mg/dL — ABNORMAL HIGH (ref 70–99)

## 2020-02-19 SURGERY — PHACOEMULSIFICATION, CATARACT, WITH IOL INSERTION
Anesthesia: Monitor Anesthesia Care | Site: Eye | Laterality: Right

## 2020-02-19 MED ORDER — LACTATED RINGERS IV SOLN: INTRAVENOUS | Status: DC

## 2020-02-19 MED ORDER — EPINEPHRINE PF 1 MG/ML IJ SOLN
INTRAOCULAR | Status: DC | PRN
  Administered 2020-02-19: 109 mL via OPHTHALMIC

## 2020-02-19 MED ORDER — LIDOCAINE HCL (PF) 2 % IJ SOLN
INTRAOCULAR | Status: DC | PRN
  Administered 2020-02-19: 4 mL via INTRAOCULAR

## 2020-02-19 MED ORDER — SODIUM HYALURONATE 23 MG/ML IO SOLN
INTRAOCULAR | Status: DC | PRN
  Administered 2020-02-19: 0.6 mL via INTRAOCULAR

## 2020-02-19 MED ORDER — FENTANYL CITRATE (PF) 100 MCG/2ML IJ SOLN
INTRAMUSCULAR | Status: DC | PRN
  Administered 2020-02-19: 75 ug via INTRAVENOUS

## 2020-02-19 MED ORDER — ACETAMINOPHEN 325 MG PO TABS: 325.0000 mg | ORAL_TABLET | Freq: Once | ORAL | Status: DC

## 2020-02-19 MED ORDER — MOXIFLOXACIN HCL 0.5 % OP SOLN
OPHTHALMIC | Status: DC | PRN
  Administered 2020-02-19: 0.2 mL via OPHTHALMIC

## 2020-02-19 MED ORDER — SODIUM HYALURONATE 10 MG/ML IO SOLN
INTRAOCULAR | Status: DC | PRN
  Administered 2020-02-19: 0.55 mL via INTRAOCULAR

## 2020-02-19 MED ORDER — ACETAMINOPHEN 160 MG/5ML PO SOLN: 325.0000 mg | Freq: Once | ORAL | Status: DC

## 2020-02-19 MED ORDER — TETRACAINE HCL 0.5 % OP SOLN
1.0000 [drp] | OPHTHALMIC | Status: DC | PRN
  Administered 2020-02-19 (×3): 1 [drp] via OPHTHALMIC

## 2020-02-19 MED ORDER — ARMC OPHTHALMIC DILATING DROPS
1.0000 "application " | OPHTHALMIC | Status: DC | PRN
  Administered 2020-02-19 (×3): 1 via OPHTHALMIC

## 2020-02-19 SURGICAL SUPPLY — 19 items
CANNULA ANT/CHMB 27G (MISCELLANEOUS) ×2 IMPLANT
CANNULA ANT/CHMB 27GA (MISCELLANEOUS) ×4 IMPLANT
DISSECTOR HYDRO NUCLEUS 50X22 (MISCELLANEOUS) ×2 IMPLANT
GLOVE SURG LX 7.5 STRW (GLOVE) ×1
GLOVE SURG LX STRL 7.5 STRW (GLOVE) ×1 IMPLANT
GLOVE SURG SYN 8.5  E (GLOVE) ×1
GLOVE SURG SYN 8.5 E (GLOVE) ×1 IMPLANT
GLOVE SURG SYN 8.5 PF PI (GLOVE) ×1 IMPLANT
GOWN STRL REUS W/ TWL LRG LVL3 (GOWN DISPOSABLE) ×2 IMPLANT
GOWN STRL REUS W/TWL LRG LVL3 (GOWN DISPOSABLE) ×4
LENS IOL TECNIS EYHANCE 20.5 (Intraocular Lens) ×1 IMPLANT
MARKER SKIN DUAL TIP RULER LAB (MISCELLANEOUS) ×2 IMPLANT
PACK DR. KING ARMS (PACKS) ×2 IMPLANT
PACK EYE AFTER SURG (MISCELLANEOUS) ×2 IMPLANT
PACK OPTHALMIC (MISCELLANEOUS) ×2 IMPLANT
SYR 3ML LL SCALE MARK (SYRINGE) ×2 IMPLANT
SYR TB 1ML LUER SLIP (SYRINGE) ×2 IMPLANT
WATER STERILE IRR 250ML POUR (IV SOLUTION) ×2 IMPLANT
WIPE NON LINTING 3.25X3.25 (MISCELLANEOUS) ×2 IMPLANT

## 2020-02-19 NOTE — Transfer of Care (Signed)
Immediate Anesthesia Transfer of Care Note  Patient: Francisco Knapp  Procedure(s) Performed: CATARACT EXTRACTION PHACO AND INTRAOCULAR LENS PLACEMENT (IOC) RIGHT DIABETIC 6.71 00:59.0 (Right Eye)  Patient Location: PACU  Anesthesia Type: MAC  Level of Consciousness: awake, alert  and patient cooperative  Airway and Oxygen Therapy: Patient Spontanous Breathing and Patient connected to supplemental oxygen  Post-op Assessment: Post-op Vital signs reviewed, Patient's Cardiovascular Status Stable, Respiratory Function Stable, Patent Airway and No signs of Nausea or vomiting  Post-op Vital Signs: Reviewed and stable  Complications: No complications documented.

## 2020-02-19 NOTE — Anesthesia Procedure Notes (Signed)
Procedure Name: MAC Date/Time: 02/19/2020 8:01 AM Performed by: Silvana Newness, CRNA Pre-anesthesia Checklist: Patient identified, Emergency Drugs available, Suction available, Patient being monitored and Timeout performed Patient Re-evaluated:Patient Re-evaluated prior to induction Oxygen Delivery Method: Nasal cannula Placement Confirmation: positive ETCO2

## 2020-02-19 NOTE — Anesthesia Preprocedure Evaluation (Signed)
Anesthesia Evaluation  Patient identified by MRN, date of birth, ID band Patient awake    Reviewed: Allergy & Precautions, H&P , NPO status , Patient's Chart, lab work & pertinent test results  Airway Mallampati: II  TM Distance: >3 FB Neck ROM: full    Dental no notable dental hx.    Pulmonary    Pulmonary exam normal breath sounds clear to auscultation       Cardiovascular hypertension, Normal cardiovascular exam Rhythm:regular Rate:Normal     Neuro/Psych    GI/Hepatic   Endo/Other  diabetes, Type 2  Renal/GU      Musculoskeletal   Abdominal   Peds  Hematology   Anesthesia Other Findings   Reproductive/Obstetrics                             Anesthesia Physical  Anesthesia Plan  ASA: II  Anesthesia Plan: MAC   Post-op Pain Management:    Induction:   PONV Risk Score and Plan: 1 and Treatment may vary due to age or medical condition, Midazolam and TIVA  Airway Management Planned:   Additional Equipment:   Intra-op Plan:   Post-operative Plan:   Informed Consent: I have reviewed the patients History and Physical, chart, labs and discussed the procedure including the risks, benefits and alternatives for the proposed anesthesia with the patient or authorized representative who has indicated his/her understanding and acceptance.     Dental Advisory Given  Plan Discussed with: CRNA  Anesthesia Plan Comments:         Anesthesia Quick Evaluation

## 2020-02-19 NOTE — H&P (Signed)
[facility]   Primary Care Physician:  Mick Sell, MD Ophthalmologist: Dr. Willey Blade  Pre-Procedure History & Physical: HPI:  Francisco Knapp is a 87 y.o. male here for cataract surgery.   Past Medical History:  Diagnosis Date  . Diabetes mellitus without complication (HCC)    Type 2  . HOH (hard of hearing)   . Hypercholesteremia   . Hypertension   . Shingles    HX of    Past Surgical History:  Procedure Laterality Date  . CARDIAC CATHETERIZATION    . CARDIAC SURGERY    . CATARACT EXTRACTION W/PHACO Left 01/29/2020   Procedure: CATARACT EXTRACTION PHACO AND INTRAOCULAR LENS PLACEMENT (IOC) LEFT DIABETIC;  Surgeon: Nevada Crane, MD;  Location: [facility];  Service: Ophthalmology;  Laterality: Left;  Diabetic - oral meds  . CORONARY ARTERY BYPASS GRAFT  2009   x4  . VEIN LIGATION AND STRIPPING      Prior to Admission medications   Medication Sig Start Date End Date Taking? Authorizing Provider  aspirin 81 MG chewable tablet Chew 81 mg by mouth daily.   Yes [provider]  cetirizine (ZYRTEC) 10 MG chewable tablet Chew 10 mg by mouth daily.   Yes [provider]  metoprolol succinate (TOPROL-XL) 25 MG 24 hr tablet Take 25 mg by mouth daily. 03/18/18  Yes [provider]  Multiple Vitamin (MULTIVITAMIN WITH MINERALS) TABS tablet Take 1 tablet by mouth daily.   Yes [provider]  ramipril (ALTACE) 5 MG capsule Take 5 mg by mouth daily. 03/18/18  Yes [provider]  simvastatin (ZOCOR) 40 MG tablet Take 40 mg by mouth Nightly. 03/18/18  Yes [provider]  metFORMIN (GLUCOPHAGE) 500 MG tablet Take 1 tablet (500 mg total) by mouth daily with breakfast. 09/30/18 09/30/19  Willy Eddy, MD    Allergies as of 01/31/2020  . (No Known Allergies)    History reviewed. No pertinent family history.  Social History   Socioeconomic History  . Marital status: Single    Spouse name: Not on  file  . Number of children: Not on file  . Years of education: Not on file  . Highest education level: Not on file  Occupational History  . Not on file  Tobacco Use  . Smoking status: Never Smoker  . Smokeless tobacco: Never Used  Substance and Sexual Activity  . Alcohol use: Never  . Drug use: Not on file  . Sexual activity: Not on file  Other Topics Concern  . Not on file  Social History Narrative  . Not on file   Social Determinants of Health   Financial Resource Strain: Not on file  Food Insecurity: Not on file  Transportation Needs: Not on file  Physical Activity: Not on file  Stress: Not on file  Social Connections: Not on file  Intimate Partner Violence: Not on file    Review of Systems: See HPI, otherwise negative ROS  Physical Exam: BP (!) 185/81   Pulse 71   Temp (!) 97 F (36.1 C) (Temporal)   Ht 5\' 11"  (1.803 m)   Wt 75.3 kg   SpO2 99%   BMI 23.15 kg/m  General:   Alert,  pleasant and cooperative in NAD Head:  Normocephalic and atraumatic. Respiratory:  Normal work of breathing.  Impression/Plan: Francisco Knapp is here for cataract surgery.  Risks, benefits, limitations, and alternatives regarding cataract surgery have been reviewed with the patient.  Questions have been  answered.  All parties agreeable.   Willey Blade, MD  02/19/2020, 7:52 AM

## 2020-02-19 NOTE — Anesthesia Postprocedure Evaluation (Signed)
Anesthesia Post Note  Patient: Francisco Knapp  Procedure(s) Performed: CATARACT EXTRACTION PHACO AND INTRAOCULAR LENS PLACEMENT (IOC) RIGHT DIABETIC 6.71 00:59.0 (Right Eye)     Patient location during evaluation: PACU Anesthesia Type: MAC Level of consciousness: awake and alert and oriented Pain management: satisfactory to patient Vital Signs Assessment: post-procedure vital signs reviewed and stable Respiratory status: spontaneous breathing, nonlabored ventilation and respiratory function stable Cardiovascular status: blood pressure returned to baseline and stable Postop Assessment: Adequate PO intake and No signs of nausea or vomiting Anesthetic complications: no   No complications documented.  Raliegh Ip

## 2020-02-19 NOTE — Op Note (Signed)
OPERATIVE NOTE  Francisco Knapp [id number] 02/19/2020   PREOPERATIVE DIAGNOSIS:  Nuclear sclerotic cataract right eye.  H25.11   POSTOPERATIVE DIAGNOSIS:    Nuclear sclerotic cataract right eye.     PROCEDURE:  Phacoemusification with posterior chamber intraocular lens placement of the right eye   LENS:   Implant Name Type Inv. Item Serial No. Manufacturer Lot No. LRB No. Used Action  LENS IOL TECNIS EYHANCE 20.5 - [id number] Intraocular Lens LENS IOL TECNIS EYHANCE 20.5 [id number]   Right 1 Implanted       Procedure(s): CATARACT EXTRACTION PHACO AND INTRAOCULAR LENS PLACEMENT (IOC) RIGHT DIABETIC 6.71 00:59.0 (Right)  DIB00 +20.5   ULTRASOUND TIME: 0 minutes 59 seconds.  CDE 6.71   SURGEON:  Willey Blade, MD, MPH  ANESTHESIOLOGIST: Anesthesiologist: Ranee Gosselin, MD CRNA: Michaele Offer, CRNA   ANESTHESIA:  Topical with tetracaine drops augmented with 1% preservative-free intracameral lidocaine.  ESTIMATED BLOOD LOSS: less than 1 mL.   COMPLICATIONS:  None.   DESCRIPTION OF PROCEDURE:  The patient was identified in the holding room and transported to the operating room and placed in the supine position under the operating microscope.  The right eye was identified as the operative eye and it was prepped and draped in the usual sterile ophthalmic fashion.   A 1.0 millimeter clear-corneal paracentesis was made at the 10:30 position. 0.5 ml of preservative-free 1% lidocaine with epinephrine was injected into the anterior chamber.  The anterior chamber was filled with Healon 5 viscoelastic.  A 2.4 millimeter keratome was used to make a near-clear corneal incision at the 8:00 position.  A curvilinear capsulorrhexis was made with a cystotome and capsulorrhexis forceps.  Balanced salt solution was used to hydrodissect and hydrodelineate the nucleus.   Phacoemulsification was then used in stop and chop fashion to remove the lens nucleus and epinucleus.  The remaining cortex  was then removed using the irrigation and aspiration handpiece. Healon was then placed into the capsular bag to distend it for lens placement.  A lens was then injected into the capsular bag.  The remaining viscoelastic was aspirated.   Wounds were hydrated with balanced salt solution.  The anterior chamber was inflated to a physiologic pressure with balanced salt solution.   Intracameral vigamox 0.1 mL undiluted was injected into the eye and a drop placed onto the ocular surface.  No wound leaks were noted.  The patient was taken to the recovery room in stable condition without complications of anesthesia or surgery  Willey Blade 02/19/2020, 8:22 AM

## 2022-02-05 ENCOUNTER — Emergency Department: Payer: No Typology Code available for payment source

## 2022-02-05 ENCOUNTER — Other Ambulatory Visit: Payer: Self-pay

## 2022-02-05 ENCOUNTER — Observation Stay
Admission: EM | Admit: 2022-02-05 | Discharge: 2022-02-06 | Disposition: A | Discharge status: Home / Self Care | Payer: No Typology Code available for payment source | Attending: Internal Medicine | Admitting: Internal Medicine

## 2022-02-05 ENCOUNTER — Encounter: Payer: Self-pay | Admitting: Emergency Medicine

## 2022-02-05 ENCOUNTER — Observation Stay: Payer: No Typology Code available for payment source

## 2022-02-05 DIAGNOSIS — R4701 Aphasia: Secondary | ICD-10-CM | POA: Diagnosis present

## 2022-02-05 DIAGNOSIS — E119 Type 2 diabetes mellitus without complications: Secondary | ICD-10-CM | POA: Insufficient documentation

## 2022-02-05 DIAGNOSIS — G459 Transient cerebral ischemic attack, unspecified: Principal | ICD-10-CM | POA: Insufficient documentation

## 2022-02-05 DIAGNOSIS — Z7984 Long term (current) use of oral hypoglycemic drugs: Secondary | ICD-10-CM | POA: Diagnosis not present

## 2022-02-05 DIAGNOSIS — I10 Essential (primary) hypertension: Secondary | ICD-10-CM | POA: Insufficient documentation

## 2022-02-05 DIAGNOSIS — I2510 Atherosclerotic heart disease of native coronary artery without angina pectoris: Secondary | ICD-10-CM | POA: Diagnosis not present

## 2022-02-05 DIAGNOSIS — Z951 Presence of aortocoronary bypass graft: Secondary | ICD-10-CM | POA: Diagnosis not present

## 2022-02-05 DIAGNOSIS — Z7982 Long term (current) use of aspirin: Secondary | ICD-10-CM | POA: Diagnosis not present

## 2022-02-05 DIAGNOSIS — E7800 Pure hypercholesterolemia, unspecified: Secondary | ICD-10-CM | POA: Diagnosis present

## 2022-02-05 DIAGNOSIS — H903 Sensorineural hearing loss, bilateral: Secondary | ICD-10-CM | POA: Insufficient documentation

## 2022-02-05 DIAGNOSIS — Z79899 Other long term (current) drug therapy: Secondary | ICD-10-CM | POA: Diagnosis not present

## 2022-02-05 LAB — COMPREHENSIVE METABOLIC PANEL
ALT: 15 U/L (ref 0–44)
AST: 22 U/L (ref 15–41)
Albumin: 3.9 g/dL (ref 3.5–5.0)
Alkaline Phosphatase: 61 U/L (ref 38–126)
Anion gap: 10 (ref 5–15)
BUN: 24 mg/dL — ABNORMAL HIGH (ref 8–23)
CO2: 21 mmol/L — ABNORMAL LOW (ref 22–32)
Calcium: 9.1 mg/dL (ref 8.9–10.3)
Chloride: 103 mmol/L (ref 98–111)
Creatinine, Ser: 1.18 mg/dL (ref 0.61–1.24)
GFR, Estimated: 56 mL/min — ABNORMAL LOW (ref >60)
Glucose, Bld: 134 mg/dL — ABNORMAL HIGH (ref 70–99)
Potassium: 4.1 mmol/L (ref 3.5–5.1)
Sodium: 134 mmol/L — ABNORMAL LOW (ref 135–145)
Total Bilirubin: 0.7 mg/dL (ref 0.3–1.2)
Total Protein: 6.7 g/dL (ref 6.5–8.1)

## 2022-02-05 LAB — CBC WITH DIFFERENTIAL/PLATELET
Abs Immature Granulocytes: 0.02 10*3/uL (ref 0.00–0.07)
Basophils Absolute: 0.1 10*3/uL (ref 0.0–0.1)
Basophils Relative: 1 %
Eosinophils Absolute: 0.4 10*3/uL (ref 0.0–0.5)
Eosinophils Relative: 5 %
HCT: 37.8 % — ABNORMAL LOW (ref 39.0–52.0)
Hemoglobin: 12.5 g/dL — ABNORMAL LOW (ref 13.0–17.0)
Immature Granulocytes: 0 %
Lymphocytes Relative: 38 %
Lymphs Abs: 3.1 10*3/uL (ref 0.7–4.0)
MCH: 29.3 pg (ref 26.0–34.0)
MCHC: 33.1 g/dL (ref 30.0–36.0)
MCV: 88.7 fL (ref 80.0–100.0)
Monocytes Absolute: 0.5 10*3/uL (ref 0.1–1.0)
Monocytes Relative: 6 %
Neutro Abs: 4.2 10*3/uL (ref 1.7–7.7)
Neutrophils Relative %: 50 %
Platelets: 215 10*3/uL (ref 150–400)
RBC: 4.26 MIL/uL (ref 4.22–5.81)
RDW: 13.3 % (ref 11.5–15.5)
WBC: 8.3 10*3/uL (ref 4.0–10.5)
nRBC: 0 % (ref 0.0–0.2)

## 2022-02-05 LAB — PROTIME-INR
INR: 1.1 (ref 0.8–1.2)
Prothrombin Time: 13.9 seconds (ref 11.4–15.2)

## 2022-02-05 LAB — APTT: aPTT: 31 seconds (ref 24–36)

## 2022-02-05 MED ORDER — STROKE: EARLY STAGES OF RECOVERY BOOK: Freq: Once | Status: AC

## 2022-02-05 MED ORDER — ACETAMINOPHEN 325 MG PO TABS: 650.0000 mg | ORAL_TABLET | ORAL | Status: DC | PRN

## 2022-02-05 MED ORDER — INSULIN ASPART 100 UNIT/ML IJ SOLN
0.0000 [IU] | Freq: Three times a day (TID) | INTRAMUSCULAR | Status: DC
  Administered 2022-02-06: 5 [IU] via SUBCUTANEOUS
  Filled 2022-02-05: qty 1

## 2022-02-05 MED ORDER — ACETAMINOPHEN 650 MG RE SUPP: 650.0000 mg | RECTAL | Status: DC | PRN

## 2022-02-05 MED ORDER — ACETAMINOPHEN 160 MG/5ML PO SOLN: 650.0000 mg | ORAL | Status: DC | PRN

## 2022-02-05 MED ORDER — CLOPIDOGREL BISULFATE 75 MG PO TABS: 300.0000 mg | ORAL_TABLET | Freq: Once | ORAL | Status: DC

## 2022-02-05 MED ORDER — ASPIRIN 81 MG PO CHEW
81.0000 mg | CHEWABLE_TABLET | Freq: Every day | ORAL | Status: DC
  Administered 2022-02-06: 81 mg via ORAL
  Filled 2022-02-05: qty 1

## 2022-02-05 MED ORDER — IOHEXOL 350 MG/ML SOLN
75.0000 mL | Freq: Once | INTRAVENOUS | Status: AC | PRN
  Administered 2022-02-05: 75 mL via INTRAVENOUS

## 2022-02-05 MED ORDER — SIMVASTATIN 20 MG PO TABS: 40.0000 mg | ORAL_TABLET | Freq: Every day | ORAL | Status: DC

## 2022-02-05 MED ORDER — INSULIN ASPART 100 UNIT/ML IJ SOLN: 0.0000 [IU] | Freq: Every day | INTRAMUSCULAR | Status: DC

## 2022-02-05 MED ORDER — CLOPIDOGREL BISULFATE 75 MG PO TABS
75.0000 mg | ORAL_TABLET | Freq: Every day | ORAL | Status: DC
  Administered 2022-02-06: 75 mg via ORAL
  Filled 2022-02-05: qty 1

## 2022-02-05 MED ORDER — ENOXAPARIN SODIUM 40 MG/0.4ML IJ SOSY: 40.0000 mg | PREFILLED_SYRINGE | Freq: Every day | INTRAMUSCULAR | Status: DC

## 2022-02-05 NOTE — Consult Note (Signed)
Neurology Consultation Reason for Consult: TIA Requesting Physician: Dr. Fuller Plan  CC: Transient slurred speech   History is obtained from: Patient, chart review and EDP, as well as son at bedside  HPI: Francisco Knapp is a 89 y.o. male with medical history significant for hypertension, hyperlipidemia, diabetes, CAD s/p CABG x 2 (2011),moderate age-related hearing loss, lumbago  He was in his usual state of health when he noticed he could not get the right words out.  He noted he was saying the wrong words but was unable to correct this.  This was heard by both a friend as well as his son both of whom had called him.  Symptoms resolved within a few minutes, but he had had a similar episode earlier this week.  Son encouraged him to come to the ED for evaluation and he was admitted for TIA workup on my recommendation  LKW: Not applicable, TIA Thrombolytic given?: No, at baseline at time of ED arrival IA performed?: No, exam not c/w LVO Premorbid modified rankin scale:      0 - No symptoms.  ROS: All other review of systems was negative except as noted in the HPI.   Past Medical History:  Diagnosis Date   Diabetes mellitus without complication (HCC)    Type 2   HOH (hard of hearing)    Hypercholesteremia    Hypertension    Shingles    HX of   Past Surgical History:  Procedure Laterality Date   CARDIAC CATHETERIZATION     CARDIAC SURGERY     CATARACT EXTRACTION W/PHACO Left 01/29/2020   Procedure: CATARACT EXTRACTION PHACO AND INTRAOCULAR LENS PLACEMENT (IOC) LEFT DIABETIC;  Surgeon: Nevada Crane, MD;  Location: [facility];  Service: Ophthalmology;  Laterality: Left;  Diabetic - oral meds   CATARACT EXTRACTION W/PHACO Right 02/19/2020   Procedure: CATARACT EXTRACTION PHACO AND INTRAOCULAR LENS PLACEMENT (IOC) RIGHT DIABETIC 6.71 00:59.0;  Surgeon: Nevada Crane, MD;  Location: [facility];  Service: Ophthalmology;  Laterality: Right;   CORONARY ARTERY BYPASS  GRAFT  2009   x4   VEIN LIGATION AND STRIPPING     Current Outpatient Medications  Medication Instructions   aspirin 81 mg, Oral, Daily   cetirizine (ZYRTEC) 10 mg, Oral, Daily   metFORMIN (GLUCOPHAGE) 500 mg, Oral, Daily with breakfast   metoprolol succinate (TOPROL-XL) 25 mg, Oral, Daily   Multiple Vitamin (MULTIVITAMIN WITH MINERALS) TABS tablet 1 tablet, Daily   ramipril (ALTACE) 5 mg, Oral, Daily   simvastatin (ZOCOR) 40 mg, Oral, (Dosepack) Nightly - one time     History reviewed. No pertinent family history.   Social History:  reports that he has never smoked. He has never used smokeless tobacco. He reports that he does not drink alcohol. No history on file for drug use.   Exam: Current vital signs: BP (!) 155/74 (BP Location: Left Arm)   Pulse 60   Temp (!) 97.3 F (36.3 C)   Resp 16   Ht 5\' 11"  (1.803 m)   Wt 77.1 kg   SpO2 99%   BMI 23.70 kg/m  Vital signs in last 24 hours: Temp:  [97.3 F (36.3 C)-98.7 F (37.1 C)] 97.3 F (36.3 C) (01/19 0722) Pulse Rate:  [53-76] 60 (01/19 0722) Resp:  [16-19] 16 (01/19 0722) BP: (133-178)/(67-84) 155/74 (01/19 0722) SpO2:  [98 %-100 %] 99 % (01/19 0722) Weight:  [77.1 kg] 77.1 kg (01/19 0228)   Physical Exam  Constitutional: Appears well-developed and well-nourished.  Mild to moderate age-related kyphosis Psych: Affect appropriate to situation, pleasant and cooperative Eyes: No scleral injection HENT: No oropharyngeal obstruction.  MSK: no joint deformities.  Cardiovascular: Normal rate and regular rhythm.  Perfusing extremities well Respiratory: Effort normal, non-labored breathing GI: Soft.  No distension. There is no tenderness.  Skin: Warm dry and intact visible skin  Neuro: Mental Status: Patient is awake, alert, oriented to person, place, month, year, and situation. Patient is able to give a clear and coherent history. No signs of aphasia or neglect Cranial Nerves: II: Visual Fields are full. Pupils  are equal, round, and reactive to light.   III,IV, VI: EOMI without ptosis or diploplia.  V: Facial sensation is symmetric to temperature VII: Facial movement is symmetric.  VIII: hearing is intact to voice X: Uvula elevates symmetrically XI: Shoulder shrug is symmetric. XII: tongue is midline without atrophy or fasciculations.  Motor: Tone is normal. Bulk is normal. 5/5 strength was present in all four extremities within limits of age.  Sensory: Sensation is symmetric to light touch and temperature in the arms and legs. Deep Tendon Reflexes: 2+ and symmetric in the brachioradialis and patellae.  Cerebellar: FNF and HKS are intact bilaterally Gait:  Deferred   NIHSS total 0  1:05 PM  I have reviewed labs in epic and the results pertinent to this consultation are:  Basic Metabolic Panel: Recent Labs  Lab 02/05/22 1909  NA 134*  K 4.1  CL 103  CO2 21*  GLUCOSE 134*  BUN 24*  CREATININE 1.18  CALCIUM 9.1  GFR 56  CBC: Recent Labs  Lab 02/05/22 1909  WBC 8.3  NEUTROABS 4.2  HGB 12.5*  HCT 37.8*  MCV 88.7  PLT 215    Coagulation Studies: Recent Labs    02/05/22 1909  LABPROT 13.9  INR 1.1    Lab Results  Component Value Date   CHOL 130 02/06/2022   HDL 48 02/06/2022   LDLCALC 66 02/06/2022   TRIG 82 02/06/2022   CHOLHDL 2.7 02/06/2022   Lab Results  Component Value Date   HGBA1C 6.6 (H) 02/05/2022     I have reviewed the images obtained:  MRI Brain personally reviewed, agree with radiology:   No acute intracranial process. No evidence of acute or subacute infarct.  CTA personally reviewed, agree with radiology:   1. 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 The proximal left V2 is diminutive and opacification diminishes by the distal V2. Additional mild-to-moderate stenosis at the origin. 2. Severe  stenosis in the left cavernous and right supraclinoid ICAs and moderate stenosis in the right cavernous and left supraclinoid ICA. 3. Severe stenosis in the distal right M1, at the origin of the anterior left M2 branch, and at the origin of a posterior left M3 branch. 4. Moderate to severe stenosis at the origin of the right vertebral artery, which is otherwise patent to the skull base. 5. No hemodynamically significant stenosis in the neck. 6. Aortic atherosclerosis.  ECHO read pending   Impression: TIA affecting the language center of this patient's brain.  Given he does have severe stenoses in the bilateral MCA vessels, I do believe this is symptomatic atherosclerotic disease representing an atheroembolic event, however cannot rule out cardioembolic event at this time given his significant heart history.  Per SAMPRIS trial, will do 90 days of DAPT followed by aspirin monotherapy,  especially given this patient has not had any significant bleeding history.  Initial verbal recommendations on discussion with EDP:  - Admit for TIA/stroke workup given ABCD2 risk score of 6 and clinical story as relayed to me - Stroke labs HgbA1c, fasting lipid panel - MRI brain w/o  - CTA if renal function allows or MRA of the brain without contrast and Carotid duplex if Cr too low  - Continue 81 mg daily aspirin - 300 mg plavix load with 75 mg daily, course to be determined pending vessel imaging - ECHO  - Permissive hypertension, may be relaxed if MRI negative - Tele - Full consult to follow in the AM  Additional recommendations after full evaluation: - Continue Plavix 75 mg daily for 90 day course (in addition to aspirin 81 mg daily, which should be continued indefinitely) - LDL and A1c meeting goal, no indication to escalate treatment - Goal gradual normotension given negative MRI brain - Close follow-up with primary care physician, if echo results with left atrial enlargement recommend arrangement of  an outpatient 30-day event monitor - Neurologically patient is stable for discharge and inpatient neurology will be available as needed going forward, discussed with primary team via secure chat  Lesleigh Noe MD-PhD [facility] [phone] [facility] coverage for [facility] is from 8 AM to 4 AM in-house and 4 PM to 8 PM by telephone/video. 8 PM to 8 AM emergent questions or overnight urgent questions should be addressed to Teleneurology On-call or [facility] neurohospitalist; contact information can be found on AMION

## 2022-02-05 NOTE — ED Provider Notes (Signed)
[facility] Provider Note    Event Date/Time   First MD Initiated Contact with Patient 02/05/22 1856     (approximate)   History   Aphasia   HPI  Francisco Knapp is a 89 y.o. male with history of hypertension, hyperlipidemia, CABG on aspirin baby only who comes in with concerns for slurred speech.  Denies any recent stroke workup.  He reports that he has had another episode a couple days ago of slurred speech lasting a few hours and then another episode today.  A family friend does report that he called him yesterday at 52 AM in the morning and he was at baseline.  Then today they called around 4 PM and then noted slurred speech.  When they got to his house around 630 the slurred speech had now resolved.  He has been at his baseline self but given no prior history of this they wanted him to be evaluated.  He denies any weakness numbness or any other symptoms associated with it.  No chest pain, shortness of breath.  Reports feeling at baseline self at this time.   Physical Exam   Triage Vital Signs: Blood pressure (!) 175/84, pulse 76, temperature 98.7 F (37.1 C), temperature source Oral, resp. rate 18, SpO2 98 %.  Most recent vital signs: Vitals:   02/05/22 1900  BP: (!) 175/84  Pulse: 76  Resp: 18  Temp: 98.7 F (37.1 C)  SpO2: 98%     General: Awake, no distress.  CV:  Good peripheral perfusion.  Resp:  Normal effort.  Abd:  No distention.  Other:  NIHSS zero    ED Results / Procedures / Treatments   Labs (all labs ordered are listed, but only abnormal results are displayed) Labs Reviewed  CBC WITH DIFFERENTIAL/PLATELET  COMPREHENSIVE METABOLIC PANEL  ETHANOL  PROTIME-INR  APTT  URINE DRUG SCREEN, QUALITATIVE ([facility] ONLY)  URINALYSIS, ROUTINE W REFLEX MICROSCOPIC     EKG  My interpretation of EKG:  Normal sinus rate of 70 without any ST elevation or T wave inversions, normal intervals  RADIOLOGY I have reviewed the CT head  personally interpreted no evidence of intracranial hemorrhage  PROCEDURES:  Critical Care performed: No  .1-3 Lead EKG Interpretation  Performed by: Vanessa Bethel, MD Authorized by: Vanessa Litchfield, MD     Interpretation: normal     ECG rate:  76   ECG rate assessment: normal     Rhythm: sinus rhythm     Ectopy: PVCs     Conduction: normal      MEDICATIONS ORDERED IN ED: Medications  clopidogrel (PLAVIX) tablet 75 mg (has no administration in time range)  clopidogrel (PLAVIX) tablet 300 mg (has no administration in time range)  aspirin chewable tablet 81 mg (has no administration in time range)  simvastatin (ZOCOR) tablet 40 mg (has no administration in time range)   stroke: early stages of recovery book (has no administration in time range)  acetaminophen (TYLENOL) tablet 650 mg (has no administration in time range)    Or  acetaminophen (TYLENOL) 160 MG/5ML solution 650 mg (has no administration in time range)    Or  acetaminophen (TYLENOL) suppository 650 mg (has no administration in time range)  enoxaparin (LOVENOX) injection 40 mg (has no administration in time range)  insulin aspart (novoLOG) injection 0-15 Units (has no administration in time range)  insulin aspart (novoLOG) injection 0-5 Units (has no administration in time range)  iohexol (OMNIPAQUE) 350 MG/ML  injection 75 mL (75 mLs Intravenous Contrast Given 02/05/22 1953)     IMPRESSION / MDM / ASSESSMENT AND PLAN / ED COURSE  I reviewed the triage vital signs and the nursing notes.   Patient's presentation is most consistent with acute presentation with potential threat to life or bodily function.   Patient comes in with what sounds like TIA.  CT head ordered evaluate for intracranial hemorrhage CT angio to evaluate for any dissection or plaque to cause recurrent TIAs.  Labs ordered evaluate any evidence of dehydration and AKI.  This was not called as a stroke code given patient's NIH stroke scale was 0 had  resolution of symptoms.  I did consult Dr. Dennison Mascot from neuro who did recommend that patient be admitted given he is high risk for stroke workup given he is fully functionable and does all his ADLs lives by himself.  As long as there is no intracranial hemorrhage recommends Plavix 300 and then 75 daily.  Coags normal CBC normal CMP reassuring.  CTA shows concerns for left vertebral artery occlusion but that looks more chronic in nature would not explain his symptoms.  There is also some severe stenosis of distal right M1 that could be more consistent with his symptoms but no LVO nothing that would need to be intervention on overnight when I discussed the case with the radiologist Dr. Lennon Alstrom especially since patient is currently asymptomatic however discussed with patient he is willing to stay in the hospital for further workup.  Will admit to the hospital team  The patient is on the cardiac monitor to evaluate for evidence of arrhythmia and/or significant heart rate changes.      FINAL CLINICAL IMPRESSION(S) / ED DIAGNOSES   Final diagnoses:  TIA (transient ischemic attack)     Rx / DC Orders   ED Discharge Orders     None        Note:  This document was prepared using Dragon voice recognition software and may include unintentional dictation errors.   Vanessa [place], MD 02/05/22 2207

## 2022-02-05 NOTE — ED Notes (Signed)
Jari Pigg, MD at bedside.

## 2022-02-05 NOTE — ED Triage Notes (Signed)
Patient to ED via POV for slurred speech. Family friend states when he called patient at 1 his speech was slurred and was having trouble getting his words out. [place] was yesterday evening. Symptoms have resolved at this time. C/o pain on left side of head 2/10.

## 2022-02-05 NOTE — Assessment & Plan Note (Addendum)
Holding metformin due to contrasted study Sliding scale insulin coverage.  A1c notes excellent control

## 2022-02-05 NOTE — Assessment & Plan Note (Addendum)
Hold antihypertensives of metoprolol, ramipril to allow for permissive hypertension

## 2022-02-05 NOTE — Assessment & Plan Note (Signed)
No complaints of chest pain, EKG nonacute Last seen by cardiology August 2023 Continue statin and aspirin.  Holding metoprolol and ramipril to allow for permissive hypertension

## 2022-02-05 NOTE — Assessment & Plan Note (Addendum)
Allowing for permissive hypertension.  Patient seen by neurology.  Seen by PT who recommended home health.  Passed swallow evaluation.  Symptoms have fully resolved.  A1c and lipid panel noted excellent control.  Plavix x 3 months.  Echocardiogram notes ejection fraction on low end of normal, otherwise unremarkable with normal-sized atria.

## 2022-02-05 NOTE — H&P (Signed)
History and Physical    Patient: Francisco Knapp GYF:[id number] DOB: 01-17-1924 DOA: 02/05/2022 DOS: the patient was seen and examined on 02/05/2022 PCP: Leonel Ramsay, MD  Patient coming from: Home  Chief Complaint:  Chief Complaint  Patient presents with   Aphasia    HPI: Francisco Knapp is a 89 y.o. male with medical history significant for DM, HTN, HLD, CAD s/p CABG 2011 last seen by cardiology 08/2021,, functionally independent and active at baseline who presents to the ED for evaluation of an episode of slurred speech x 2.  The first episode happened the day prior and was transient.  The second episode was noted today after someone noted him to have slowed speech during a phone call at 1600 on 1/18.  He presented by private vehicle by which time his symptoms had resolved.  He denies headache, visual disturbance, one-sided weakness numbness or tingling.  NIH score on arrival was 0.  CAT scan of the head was negative for acute ischemia or hemorrhage, mass or mass effect.  The ED provider spoke with neurologist, Dr. Curly Shores who recommended a Plavix load of 300 mg and continuing daily Plavix.  Patient previously on daily baby aspirin.  He subsequently had a CTA head and neck that showed several areas of stenosis as follows: IMPRESSION: 1. 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 The proximal left V2 is diminutive and opacification diminishes by the distal V2. Additional mild-to-moderate stenosis at the origin. 2. Severe stenosis in the left cavernous and right supraclinoid ICAs and moderate stenosis in the right cavernous and left supraclinoid ICA. 3. Severe stenosis in the distal right M1, at the origin of the anterior left M2 branch, and at the origin of a posterior left M3 branch. 4. Moderate to severe stenosis at the origin of the right  vertebral artery, which is otherwise patent to the skull base. 5. No hemodynamically significant stenosis in the neck. 6. Aortic atherosclerosis.  The ED provider subsequently spoke with IR who did not recommend any additional interventions. Other data reviewed included vitals which were unremarkable except for an elevated blood pressure of 175/84. Labs significant for anemia of 12.5 which is at baseline from 08/2021 and otherwise unremarkable.  EKG, independently reviewed and interpreted showed sinus at 49 with old anterior infarct and otherwise unremarkable.  Hospitalist consulted for admission.    Review of Systems: As mentioned in the history of present illness. All other systems reviewed and are negative.  Past Medical History:  Diagnosis Date   Diabetes mellitus without complication ([place])    Type 2   HOH (hard of hearing)    Hypercholesteremia    Hypertension    Shingles    HX of   Past Surgical History:  Procedure Laterality Date   CARDIAC CATHETERIZATION     CARDIAC SURGERY     CATARACT EXTRACTION W/PHACO Left 01/29/2020   Procedure: CATARACT EXTRACTION PHACO AND INTRAOCULAR LENS PLACEMENT ([place]) LEFT DIABETIC;  Surgeon: Eulogio Bear, MD;  Location: [facility];  Service: Ophthalmology;  Laterality: Left;  Diabetic - oral meds   CATARACT EXTRACTION W/PHACO Right 02/19/2020   Procedure: CATARACT EXTRACTION PHACO AND INTRAOCULAR LENS PLACEMENT ([facility]) RIGHT DIABETIC 6.71 00:59.0;  Surgeon: Eulogio Bear, MD;  Location: [facility];  Service: Ophthalmology;  Laterality: Right;   CORONARY ARTERY BYPASS GRAFT  2009   x4  VEIN LIGATION AND STRIPPING     Social History:  reports that he has never smoked. He has never used smokeless tobacco. He reports that he does not drink alcohol. No history on file for drug use.  No Known Allergies  History reviewed. No pertinent family history.  Prior to Admission medications   Medication Sig Start Date End Date  Taking? Authorizing Provider  aspirin 81 MG chewable tablet Chew 81 mg by mouth daily.    [provider]  cetirizine (ZYRTEC) 10 MG chewable tablet Chew 10 mg by mouth daily.    [provider]  metFORMIN (GLUCOPHAGE) 500 MG tablet Take 1 tablet (500 mg total) by mouth daily with breakfast. 09/30/18 09/30/19  Merlyn Lot, MD  metoprolol succinate (TOPROL-XL) 25 MG 24 hr tablet Take 25 mg by mouth daily. 03/18/18   [provider]  Multiple Vitamin (MULTIVITAMIN WITH MINERALS) TABS tablet Take 1 tablet by mouth daily.    [provider]  ramipril (ALTACE) 5 MG capsule Take 5 mg by mouth daily. 03/18/18   [provider]  simvastatin (ZOCOR) 40 MG tablet Take 40 mg by mouth Nightly. 03/18/18   [provider]    Physical Exam: Vitals:   02/05/22 1900  BP: (!) 175/84  Pulse: 76  Resp: 18  Temp: 98.7 F (37.1 C)  TempSrc: Oral  SpO2: 98%   Physical Exam Vitals and nursing note reviewed.  Constitutional:      General: He is not in acute distress. HENT:     Head: Normocephalic and atraumatic.  Cardiovascular:     Rate and Rhythm: Normal rate and regular rhythm.     Heart sounds: Normal heart sounds.  Pulmonary:     Effort: Pulmonary effort is normal.     Breath sounds: Normal breath sounds.  Abdominal:     Palpations: Abdomen is soft.     Tenderness: There is no abdominal tenderness.  Neurological:     Mental Status: Mental status is at baseline.     Labs on Admission: I have personally reviewed following labs and imaging studies  CBC: Recent Labs  Lab 02/05/22 1909  WBC 8.3  NEUTROABS 4.2  HGB 12.5*  HCT 37.8*  MCV 88.7  PLT 824   Basic Metabolic Panel: Recent Labs  Lab 02/05/22 1909  NA 134*  K 4.1  CL 103  CO2 21*  GLUCOSE 134*  BUN 24*  CREATININE 1.18  CALCIUM 9.1   GFR: CrCl cannot be calculated (Unknown ideal weight.). Liver Function Tests: Recent Labs  Lab 02/05/22 1909  AST 22  ALT 15   ALKPHOS 61  BILITOT 0.7  PROT 6.7  ALBUMIN 3.9   No results for input(s): "LIPASE", "AMYLASE" in the last 168 hours. No results for input(s): "AMMONIA" in the last 168 hours. Coagulation Profile: No results for input(s): "INR", "PROTIME" in the last 168 hours. Cardiac Enzymes: No results for input(s): "CKTOTAL", "CKMB", "CKMBINDEX", "TROPONINI" in the last 168 hours. BNP (last 3 results) No results for input(s): "PROBNP" in the last 8760 hours. HbA1C: No results for input(s): "HGBA1C" in the last 72 hours. CBG: No results for input(s): "GLUCAP" in the last 168 hours. Lipid Profile: No results for input(s): "CHOL", "HDL", "LDLCALC", "TRIG", "CHOLHDL", "LDLDIRECT" in the last 72 hours. Thyroid Function Tests: No results for input(s): "TSH", "T4TOTAL", "FREET4", "T3FREE", "THYROIDAB" in the last 72 hours. Anemia Panel: No results for input(s): "VITAMINB12", "FOLATE", "FERRITIN", "TIBC", "IRON", "RETICCTPCT" in the last 72 hours. Urine analysis:  Component Value Date/Time   COLORURINE STRAW (A) 09/30/2018 2028   APPEARANCEUR CLEAR (A) 09/30/2018 2028   LABSPEC 1.020 09/30/2018 2028   PHURINE 6.0 09/30/2018 2028   GLUCOSEU >=500 (A) 09/30/2018 2028   HGBUR NEGATIVE 09/30/2018 2028   BILIRUBINUR NEGATIVE 09/30/2018 2028   KETONESUR NEGATIVE 09/30/2018 2028   PROTEINUR NEGATIVE 09/30/2018 2028   NITRITE NEGATIVE 09/30/2018 2028   LEUKOCYTESUR NEGATIVE 09/30/2018 2028    Radiological Exams on Admission: CT ANGIO HEAD NECK W WO CM  Result Date: 02/05/2022 CLINICAL DATA:  Slurred speech EXAM: CT ANGIOGRAPHY HEAD AND NECK TECHNIQUE: Multidetector CT imaging of the head and neck was performed using the standard protocol during bolus administration of intravenous contrast. Multiplanar CT image reconstructions and MIPs were obtained to evaluate the vascular anatomy. Carotid stenosis measurements (when applicable) are obtained utilizing NASCET criteria, using the distal internal  carotid diameter as the denominator. RADIATION DOSE REDUCTION: This exam was performed according to the departmental dose-optimization program which includes automated exposure control, adjustment of the mA and/or kV according to patient size and/or use of iterative reconstruction technique. CONTRAST:  56mL OMNIPAQUE IOHEXOL 350 MG/ML SOLN COMPARISON:  None Available. FINDINGS: CT HEAD FINDINGS Brain: No evidence of acute infarct, hemorrhage, mass, mass effect, or midline shift. No hydrocephalus or extra-axial fluid collection. Vascular: No hyperdense vessel. Atherosclerotic calcifications in the intracranial carotid and vertebral arteries. Skull: Normal. Negative for fracture or focal lesion. Sinuses/Orbits: Mucosal thickening in the anterior ethmoid air cells and right frontal sinus. Status post bilateral lens replacements. Other: The mastoid air cells are well aerated. CTA NECK FINDINGS Aortic arch: Two-vessel arch with a common origin of the brachiocephalic and left common carotid arteries. Imaged portion shows no evidence of aneurysm or dissection. No significant stenosis of the major arch vessel origins. Mild aortic atherosclerosis. Right carotid system: No evidence of dissection, occlusion, or hemodynamically significant stenosis (greater than 50%). Atherosclerotic disease at the bifurcation and in the proximal ICA is not hemodynamically significant. Left carotid system: No evidence of dissection, occlusion, or hemodynamically significant stenosis (greater than 50%). Atherosclerotic disease at the bifurcation and in the proximal ICA is not hemodynamically significant. Vertebral arteries: Mild to moderate stenosis at the origin of the left vertebral artery, which is diminutive in the V1 segment, poorly opacified in the V2 segment, and non-opacified in the V3 segment. Right dominant system. Moderate to severe stenosis at the origin of the right vertebral artery, which is otherwise patent to the skull base  without significant stenosis, dissection, or occlusion. Skeleton: No acute osseous abnormality. Degenerative changes in the cervical spine with retro-odontoid pannus. Prior median sternotomy. Other neck: Hypoenhancing nodules in the thyroid, which measure up to 5 mm, for which no follow-up is indicated. (Reference: J Am Coll Radiol. 2015 Feb;12(2): 143-50) Upper chest: No focal pulmonary opacity or pleural effusion. Review of the MIP images confirms the above findings CTA HEAD FINDINGS Anterior circulation: Both internal carotid arteries are patent to the termini, with severe stenosis in the left cavernous and right supraclinoid ICA and moderate stenosis in the right cavernous and left supraclinoid ICA. A1 segments patent. Normal anterior communicating artery. Anterior cerebral arteries are patent to their distal aspects. No left M1 stenosis or occlusion. Severe stenosis at the origin of the anterior left M2 branch (series 10, image 63) and a more posterior left M3 branch (series 10, image [phone]). Severe stenosis in the distal right M1 (series 10, image 266). No additional significant stenosis in the right MCA branches. Posterior circulation:  The left vertebral artery is occluded in the V4 segment, with possible minimal retrograde flow at the junction (series 10, image 245. The right vertebral artery is patent to the vertebrobasilar junction with severe stenosis in the mid right V4 (series 10, image 225). The right PICA is patent. The left PICA is not visualized. Moderate stenosis in the mid basilar artery (series 10, images 253-254). Superior cerebellar arteries patent proximally. Patent P1 segments. PCAs irregular but perfused to their distal aspects without stenosis. The bilateral posterior communicating arteries are not visualized. Venous sinuses: As permitted by contrast timing, patent. Anatomic variants: None significant. Review of the MIP images confirms the above findings IMPRESSION: 1. The left vertebral  artery is occluded throughout the V3 and V4 segments, with possible minimal retrograde flow at the junction, of indeterminate acuity but favored to be chronic. The proximal left V2 is diminutive and opacification diminishes by the distal V2. Additional mild-to-moderate stenosis at the origin. 2. Severe stenosis in the left cavernous and right supraclinoid ICAs and moderate stenosis in the right cavernous and left supraclinoid ICA. 3. Severe stenosis in the distal right M1, at the origin of the anterior left M2 branch, and at the origin of a posterior left M3 branch. 4. Moderate to severe stenosis at the origin of the right vertebral artery, which is otherwise patent to the skull base. 5. No hemodynamically significant stenosis in the neck. 6. Aortic atherosclerosis. Aortic Atherosclerosis (ICD10-I70.0). These results were called by telephone at the time of interpretation on 02/05/2022 at 8:44 pm to provider [facility] , who verbally acknowledged these results. Electronically Signed   By: Merilyn Baba M.D.   On: 02/05/2022 20:47     Data Reviewed: Relevant notes from primary care and specialist visits, past discharge summaries as available in EHR, including Care Everywhere. Prior diagnostic testing as pertinent to current admission diagnoses Updated medications and problem lists for reconciliation ED course, including vitals, labs, imaging, treatment and response to treatment Triage notes, nursing and pharmacy notes and ED provider's notes Notable results as noted in HPI   Assessment and Plan: * TIA (transient ischemic attack) Permissive hypertension for first 24-48 hrs post stroke onset: Prn Labetalol IV or Vasotec IV If BP greater than 220/120  Statins for LDL goal less than 70 Plavix load of 300mg .  ASA 81mg  daily, Plavix 75mg  daily x 3 weeks then monotherapy thereafter Telemetry, Echo and MRI Avoid dextrose containing fluids, Maintain euglycemia, euthermia Neuro checks q4 hrs x 24 hrs and then  per shift Head of bed 30 degrees Physical therapy/Occupational therapy/Speech therapy if failed dysphagia screen Neurology consult to follow   CAD s/p CABG 2011 No complaints of chest pain, EKG nonacute Last seen by cardiology August 2023 Continue statin and aspirin.  Holding metoprolol and ramipril to allow for permissive hypertension  Sensorineural hearing loss, bilateral Supportive care.  Increase nursing assistance for communication of needs  Hypercholesteremia Atorvastatin  Diabetes mellitus without complication (HCC) Holding metformin due to contrasted study Sliding scale insulin coverage  Hypertension Hold antihypertensives of metoprolol, ramipril to allow for permissive hypertension          DVT prophylaxis: Lovenox  Consults: Neurology, Dr. Curly Shores  Advance Care Planning: full code  Family Communication: son, Maude Kulich POA at bedside. Explained plan of care and they voiced understanding and agreement with plan.  Disposition Plan: Back to previous home environment  Severity of Illness: The appropriate patient status for this patient is OBSERVATION. Observation status is judged to be reasonable  and necessary in order to provide the required intensity of service to ensure the patient's safety. The patient's presenting symptoms, physical exam findings, and initial radiographic and laboratory data in the context of their medical condition is felt to place them at decreased risk for further clinical deterioration. Furthermore, it is anticipated that the patient will be medically stable for discharge from the hospital within 2 midnights of admission.   Author: Athena Masse, MD 02/05/2022 9:19 PM  For on call review [email or web address].

## 2022-02-05 NOTE — Assessment & Plan Note (Signed)
Supportive care.  Increase nursing assistance for communication of needs 

## 2022-02-05 NOTE — Assessment & Plan Note (Addendum)
Continue patient's statin.  Lipid panel notes excellent control.

## 2022-02-06 ENCOUNTER — Observation Stay
Admit: 2022-02-06 | Discharge: 2022-02-06 | Disposition: A | Discharge status: Home / Self Care | Payer: No Typology Code available for payment source | Attending: Internal Medicine | Admitting: Internal Medicine

## 2022-02-06 DIAGNOSIS — E7800 Pure hypercholesterolemia, unspecified: Secondary | ICD-10-CM

## 2022-02-06 DIAGNOSIS — E119 Type 2 diabetes mellitus without complications: Secondary | ICD-10-CM | POA: Diagnosis not present

## 2022-02-06 DIAGNOSIS — G459 Transient cerebral ischemic attack, unspecified: Secondary | ICD-10-CM

## 2022-02-06 DIAGNOSIS — I10 Essential (primary) hypertension: Secondary | ICD-10-CM

## 2022-02-06 LAB — ECHOCARDIOGRAM COMPLETE
AR max vel: 1.09 cm2
AV Area VTI: 1.19 cm2
AV Area mean vel: 1.06 cm2
AV Mean grad: 7.7 mmHg
AV Peak grad: 13.8 mmHg
Ao pk vel: 1.86 m/s
Area-P 1/2: 2.77 cm2
Height: 71 in
S' Lateral: 2.8 cm
Weight: 2718.4 oz

## 2022-02-06 LAB — LIPID PANEL
Cholesterol: 130 mg/dL (ref 0–200)
HDL: 48 mg/dL (ref >40)
LDL Cholesterol: 66 mg/dL (ref 0–99)
Total CHOL/HDL Ratio: 2.7 RATIO
Triglycerides: 82 mg/dL (ref <150)
VLDL: 16 mg/dL (ref 0–40)

## 2022-02-06 LAB — GLUCOSE, CAPILLARY
Glucose-Capillary: 115 mg/dL — ABNORMAL HIGH (ref 70–99)
Glucose-Capillary: 217 mg/dL — ABNORMAL HIGH (ref 70–99)

## 2022-02-06 LAB — HEMOGLOBIN A1C
Hgb A1c MFr Bld: 6.6 % — ABNORMAL HIGH (ref 4.8–5.6)
Mean Plasma Glucose: 142.72 mg/dL

## 2022-02-06 LAB — URINE DRUG SCREEN, QUALITATIVE (ARMC ONLY)
Amphetamines, Ur Screen: NOT DETECTED
Barbiturates, Ur Screen: NOT DETECTED
Benzodiazepine, Ur Scrn: NOT DETECTED
Cannabinoid 50 Ng, Ur ~~LOC~~: NOT DETECTED
Cocaine Metabolite,Ur ~~LOC~~: NOT DETECTED
MDMA (Ecstasy)Ur Screen: NOT DETECTED
Methadone Scn, Ur: NOT DETECTED
Opiate, Ur Screen: NOT DETECTED
Phencyclidine (PCP) Ur S: NOT DETECTED
Tricyclic, Ur Screen: NOT DETECTED

## 2022-02-06 LAB — ETHANOL: Alcohol, Ethyl (B): <10 mg/dL (ref <10)

## 2022-02-06 LAB — URINALYSIS, ROUTINE W REFLEX MICROSCOPIC
Bilirubin Urine: NEGATIVE
Glucose, UA: NEGATIVE mg/dL
Hgb urine dipstick: NEGATIVE
Ketones, ur: NEGATIVE mg/dL
Leukocytes,Ua: NEGATIVE
Nitrite: NEGATIVE
Protein, ur: NEGATIVE mg/dL
Specific Gravity, Urine: 1.015 (ref 1.005–1.030)
pH: 7 (ref 5.0–8.0)

## 2022-02-06 MED ORDER — METFORMIN HCL 500 MG PO TABS: 500.0000 mg | ORAL_TABLET | Freq: Two times a day (BID) | ORAL | Status: AC

## 2022-02-06 MED ORDER — CLOPIDOGREL BISULFATE 75 MG PO TABS: 75.0000 mg | ORAL_TABLET | Freq: Every day | ORAL | 2 refills | Status: AC

## 2022-02-06 NOTE — Hospital Course (Signed)
89 year old male with past medical history of diabetes mellitus, hypertension, CAD status post CABG functionally independent who presented to the emergency room on 1/18 with 2 episodes of slurred speech, the first 1 happening the day prior and the second episode on day of presentation in the afternoon.  By time patient had come to the emergency room, symptoms had resolved.  Patient brought in for further evaluation for presumed TIA.  Neurology consulted.

## 2022-02-06 NOTE — Evaluation (Signed)
Occupational Therapy Evaluation Patient Details Name: Francisco Knapp MRN: [id number] DOB: Jul 29, 1923 Today's Date: 02/06/2022   History of Present Illness Pt is a 89 y/o M admitted on 02/05/22 after presenting to the ED with c/o slurred speech x 2. MRI was negative for acute processes; pt is being treated for TIA. PMH: DM, HTN, HLD, CAD s/p CABG, HOH, shingles   Clinical Impression   Francisco Knapp was seen for OT evaluation this date. Prior to hospital admission, pt was independent in all aspects of ADL/IADL, intermittently using a SPC for community mobility, but otherwise no AE for ADL management. Pt enjoys wood working/turning and regularly works in his shop. He is eager to get back to his regular routine. Pt lives alone in a 1 level home. He reports a friend check in on him weekly and his son visits regularly from [place]. Son present at bedside and states plan is for pt to have 24/7 assist until he is feeling back to his usual self. Currently pt reporting symptoms have resolved. Pt demonstrates baseline independence to perform ADL and mobility tasks and no strength, sensory, coordination, cognitive, or visual deficits appreciated with assessment. No skilled OT needs identified. Will sign off. Please re-consult if additional OT needs arise.       Recommendations for follow up therapy are one component of a multi-disciplinary discharge planning process, led by the attending physician.  Recommendations may be updated based on patient status, additional functional criteria and insurance authorization.   Follow Up Recommendations  No OT follow up     Assistance Recommended at Discharge PRN  Patient can return home with the following      Functional Status Assessment  Patient has not had a recent decline in their functional status  Equipment Recommendations  None recommended by OT    Recommendations for Other Services       Precautions / Restrictions Precautions Precautions:  Fall Restrictions Weight Bearing Restrictions: No      Mobility Bed Mobility Overal bed mobility: Independent             General bed mobility comments: Pt sitting on EOB at start of session and in recliner at end of session. Reports getting in/out of bed without assist.    Transfers Overall transfer level: Independent Equipment used: None               General transfer comment: Intermittently reaches for items such as bed rail/counter to steady himself, otherwise no AE or assist with functional mobility.      Balance Overall balance assessment: No apparent balance deficits (not formally assessed)                                         ADL either performed or assessed with clinical judgement   ADL Overall ADL's : At baseline                                       General ADL Comments: Pt denies any strenght, sensory, or visual changes since admission. Reports all symptoms have resolved and feels at or near his baseline level of functional independence. Performs functional mobility in room wihtout AE.     Vision Patient Visual Report: No change from baseline       Perception  Praxis      Pertinent Vitals/Pain Pain Assessment Pain Assessment: No/denies pain     Hand Dominance     Extremity/Trunk Assessment Upper Extremity Assessment Upper Extremity Assessment: Overall WFL for tasks assessed   Lower Extremity Assessment Lower Extremity Assessment: Overall WFL for tasks assessed   Cervical / Trunk Assessment Cervical / Trunk Assessment:  (forward head)   Communication Communication Communication: HOH (has hearing aids.)   Cognition Arousal/Alertness: Awake/alert Behavior During Therapy: WFL for tasks assessed/performed Overall Cognitive Status: Within Functional Limits for tasks assessed                                 General Comments: A&Ox4, pleasant, eager to go home     General Comments        Exercises Other Exercises Other Exercises: Pt/son educated on role of OT in acute setting, importance of early response and intervention to stroke symptoms using BE FAST, and routines modifications for improved safety and fucntional independence upon DC home.   Shoulder Instructions      Home Living Family/patient expects to be discharged to:: Private residence Living Arrangements: Alone Available Help at Discharge: Personal care attendant;Available PRN/intermittently;Family (PCA assists 3x/week; son lives in [place] and visits regularly.) Type of Home: House Home Access: Stairs to enter Technical brewer of Steps: 3 Entrance Stairs-Rails: None Home Layout: Multi-level;Laundry or work area in Building surveyor of Steps: wood working shop in the basement with flight of stairs to access Alternate Level Stairs-Rails: Left           Home Equipment: Kasandra Knudsen - single point          Prior Functioning/Environment Prior Level of Function : Independent/Modified Independent             Mobility Comments: Pt reports independence with mobility, doesn't use AD in the home but uses SPC when outside of the house. Endorses 1 fall in the past 6 months. ADLs Comments: Independent with ADL management. Pt reports he likes to stay active, regularly walking up and down his driveway and doing woodworking in the warmer months.        OT Problem List: Decreased safety awareness;Impaired balance (sitting and/or standing);Decreased strength;Decreased coordination      OT Treatment/Interventions:      OT Goals(Current goals can be found in the care plan section) Acute Rehab OT Goals Patient Stated Goal: To go home OT Goal Formulation: All assessment and education complete, DC therapy Time For Goal Achievement: 02/06/22 Potential to Achieve Goals: Good  OT Frequency:      Co-evaluation              AM-PAC OT "6 Clicks" Daily Activity     Outcome Measure  Help from another person eating meals?: None Help from another person taking care of personal grooming?: None Help from another person toileting, which includes using toliet, bedpan, or urinal?: None Help from another person bathing (including washing, rinsing, drying)?: None Help from another person to put on and taking off regular upper body clothing?: None Help from another person to put on and taking off regular lower body clothing?: None 6 Click Score: 24   End of Session Nurse Communication: Mobility status  Activity Tolerance: Patient tolerated treatment well Patient left: in chair;with call bell/phone within reach;with family/visitor present;with chair alarm set  OT Visit Diagnosis: Other abnormalities of gait and mobility (R26.89)  Time: 9563-8756 OT Time Calculation (min): 19 min Charges:  OT General Charges $OT Visit: 1 Visit OT Evaluation $OT Eval Low Complexity: 1 Low OT Treatments $Self Care/Home Management : 8-22 mins  Rockney Ghee, M.S., OTR/L 02/06/22, 11:39 AM

## 2022-02-06 NOTE — Progress Notes (Signed)
*  PRELIMINARY RESULTS* Echocardiogram 2D Echocardiogram has been performed.  Sherrie Sport 02/06/2022, 7:53 AM

## 2022-02-06 NOTE — TOC Transition Note (Signed)
Transition of Care [facility]) - CM/SW Discharge Note   Patient Details  Name: Francisco Knapp MRN: [id number] Date of Birth: 16-Aug-1923  Transition of Care [facility]) CM/SW Contact:  Gerilyn Pilgrim, LCSW Phone Number: 02/06/2022, 2:05 PM   Clinical Narrative:   Rollator ordered to be sent to patients house via adapt. Pt would also like [place] through [facility]. Merleen Nicely with [facility] notified.            Patient Goals and CMS Choice      Discharge Placement                         Discharge Plan and Services Additional resources added to the After Visit Summary for                                       Social Determinants of Health (SDOH) Interventions SDOH Screenings   Food Insecurity: No Food Insecurity (02/06/2022)  Housing: Low Risk  (02/06/2022)  Transportation Needs: No Transportation Needs (02/06/2022)  Utilities: Not At Risk (02/06/2022)  Tobacco Use: Low Risk  (02/05/2022)     Readmission Risk Interventions     No data to display

## 2022-02-06 NOTE — Discharge Summary (Signed)
Physician Discharge Summary   Patient: Francisco Knapp MRN: [id number] DOB: [birth year]  Admit date:     02/05/2022  Discharge date: 02/06/22  Discharge Physician: Hollice Espy   PCP: Mick Sell, MD   Recommendations at discharge:   Patient going home with home health PT and rollator walker New medication: Plavix 75 mg p.o. daily x 3 months  Discharge Diagnoses: Principal Problem:   TIA (transient ischemic attack) Active Problems:   Hypertension   Hypercholesteremia   Diabetes mellitus without complication (HCC)   CAD s/p CABG 2011   Sensorineural hearing loss, bilateral  Resolved Problems:   * No resolved hospital problems. *  Hospital Course: 89 year old male with past medical history of diabetes mellitus, hypertension, CAD status post CABG functionally independent who presented to the emergency room on 1/18 with 2 episodes of slurred speech, the first 1 happening the day prior and the second episode on day of presentation in the afternoon.  By time patient had come to the emergency room, symptoms had resolved.  Patient brought in for further evaluation for presumed TIA.  Neurology consulted.  Assessment and Plan: * TIA (transient ischemic attack) Allowing for permissive hypertension.  Patient seen by neurology.  Seen by PT who recommended home health.  Passed swallow evaluation.  Symptoms have fully resolved.  A1c and lipid panel noted excellent control.  Plavix x 3 months.  Echocardiogram notes ejection fraction on low end of normal, otherwise unremarkable with normal-sized atria.  Hypertension Hold antihypertensives of metoprolol, ramipril to allow for permissive hypertension  Hypercholesteremia Continue patient's statin.  Lipid panel notes excellent control.  Diabetes mellitus without complication (HCC) Holding metformin due to contrasted study Sliding scale insulin coverage.  A1c notes excellent control  CAD s/p CABG 2011 No complaints of chest pain,  EKG nonacute Last seen by cardiology August 2023 Continue statin and aspirin.  Holding metoprolol and ramipril to allow for permissive hypertension  Sensorineural hearing loss, bilateral Supportive care.  Increase nursing assistance for communication of needs         Consultants: Neurology Procedures performed: Echocardiogram noting ejection fraction as low end of normal, normal diastolic function Disposition: Home with home health Diet recommendation:  Discharge Diet Orders (From admission, onward)     Start     Ordered   02/06/22 0000  Diet - low sodium heart healthy        02/06/22 1331           Cardiac diet DISCHARGE MEDICATION: Allergies as of 02/06/2022   Not on File      Medication List     TAKE these medications    aspirin 81 MG chewable tablet Chew 81 mg by mouth daily.   cetirizine 10 MG chewable tablet Commonly known as: ZYRTEC Chew 10 mg by mouth daily.   clopidogrel 75 MG tablet Commonly known as: PLAVIX Take 1 tablet (75 mg total) by mouth daily. Start taking on: February 07, 2022   metFORMIN 500 MG tablet Commonly known as: Glucophage Take 1 tablet (500 mg total) by mouth 2 (two) times daily with a meal.   metoprolol succinate 25 MG 24 hr tablet Commonly known as: TOPROL-XL Take 25 mg by mouth daily.   multivitamin with minerals Tabs tablet Take 1 tablet by mouth daily.   ramipril 5 MG capsule Commonly known as: ALTACE Take 5 mg by mouth daily.   simvastatin 40 MG tablet Commonly known as: ZOCOR Take 40 mg by mouth Nightly.  Durable Medical Equipment  (From admission, onward)           Start     Ordered   02/06/22 1411  For home use only DME 4 wheeled rolling walker with seat  Once       Question:  Patient needs a walker to treat with the following condition  Answer:  Falls   02/06/22 1411            Discharge Exam: Filed Weights   02/06/22 0036 02/06/22 0228  Weight: 77.1 kg 77.1 kg    General: Alert and oriented x 3, no acute distress Cardiovascular: Regular rate and rhythm, S1-S2 Lungs: Clear to auscultation bilaterally   Condition at discharge: good  The results of significant diagnostics from this hospitalization (including imaging, microbiology, ancillary and laboratory) are listed below for reference.   Imaging Studies: ECHOCARDIOGRAM COMPLETE  Result Date: 02/06/2022    ECHOCARDIOGRAM REPORT   Patient Name:   Francisco Knapp Date of Exam: 02/06/2022 Medical Rec #:  [id number]        Height:       71.0 in Accession #:    [id number]       Weight:       169.9 lb Date of Birth:  1923/07/08        BSA:          1.967 m Patient Age:    89 years         BP:           133/74 mmHg Patient Gender: M                HR:           53 bpm. Exam Location:  [facility] Procedure: 2D Echo, Cardiac Doppler and Color Doppler Indications:     Stroke I63.9  History:         Patient has no prior history of Echocardiogram examinations.                  Risk Factors:Hypertension and Diabetes.  Sonographer:     Cristela Blue Referring Phys:  [id number] Andris Baumann Diagnosing Phys: Marcina Aadon MD  Sonographer Comments: Technically challenging study due to limited acoustic windows, no parasternal window and suboptimal apical window. IMPRESSIONS  1. Left ventricular ejection fraction, by estimation, is 50 to 55%. The left ventricle has low normal function. The left ventricle has no regional wall motion abnormalities. Left ventricular diastolic parameters were normal.  2. Right ventricular systolic function is normal. The right ventricular size is normal.  3. The mitral valve is normal in structure. Mild to moderate mitral valve regurgitation. No evidence of mitral stenosis.  4. The aortic valve is normal in structure. Aortic valve regurgitation is mild. Mild aortic valve stenosis.  5. The inferior vena cava is normal in size with greater than 50% respiratory variability, suggesting right atrial pressure  of 3 mmHg. FINDINGS  Left Ventricle: Left ventricular ejection fraction, by estimation, is 50 to 55%. The left ventricle has low normal function. The left ventricle has no regional wall motion abnormalities. The left ventricular internal cavity size was normal in size. There is no left ventricular hypertrophy. Left ventricular diastolic parameters were normal. Right Ventricle: The right ventricular size is normal. No increase in right ventricular wall thickness. Right ventricular systolic function is normal. Left Atrium: Left atrial size was normal in size. Right Atrium: Right atrial size was normal in size. Pericardium: There is no evidence of  pericardial effusion. Mitral Valve: The mitral valve is normal in structure. Mild to moderate mitral valve regurgitation. No evidence of mitral valve stenosis. Tricuspid Valve: The tricuspid valve is normal in structure. Tricuspid valve regurgitation is mild . No evidence of tricuspid stenosis. Aortic Valve: The aortic valve is normal in structure. Aortic valve regurgitation is mild. Mild aortic stenosis is present. Aortic valve mean gradient measures 7.7 mmHg. Aortic valve peak gradient measures 13.8 mmHg. Aortic valve area, by VTI measures 1.19 cm. Pulmonic Valve: The pulmonic valve was normal in structure. Pulmonic valve regurgitation is not visualized. No evidence of pulmonic stenosis. Aorta: The aortic root is normal in size and structure. Venous: The inferior vena cava is normal in size with greater than 50% respiratory variability, suggesting right atrial pressure of 3 mmHg. IAS/Shunts: No atrial level shunt detected by color flow Doppler.  LEFT VENTRICLE PLAX 2D LVIDd:         3.80 cm   Diastology LVIDs:         2.80 cm   LV e' medial:    5.11 cm/s LV PW:         1.10 cm   LV E/e' medial:  19.6 LV IVS:        1.10 cm   LV e' lateral:   8.05 cm/s LVOT diam:     2.00 cm   LV E/e' lateral: 12.4 LV SV:         55 LV SV Index:   28 LVOT Area:     3.14 cm  RIGHT VENTRICLE  RV Basal diam:  2.40 cm RV Mid diam:    2.30 cm RV S prime:     9.36 cm/s TAPSE (M-mode): 1.9 cm LEFT ATRIUM             Index        RIGHT ATRIUM          Index LA diam:        4.50 cm 2.29 cm/m   RA Area:     7.48 cm LA Vol (A2C):   42.9 ml 21.81 ml/m  RA Volume:   12.30 ml 6.25 ml/m LA Vol (A4C):   50.8 ml 25.82 ml/m LA Biplane Vol: 52.0 ml 26.43 ml/m  AORTIC VALVE AV Area (Vmax):    1.09 cm AV Area (Vmean):   1.06 cm AV Area (VTI):     1.19 cm AV Vmax:           186.00 cm/s AV Vmean:          128.667 cm/s AV VTI:            0.460 m AV Peak Grad:      13.8 mmHg AV Mean Grad:      7.7 mmHg LVOT Vmax:         64.70 cm/s LVOT Vmean:        43.600 cm/s LVOT VTI:          0.175 m LVOT/AV VTI ratio: 0.38 MITRAL VALVE                TRICUSPID VALVE MV Area (PHT): 2.77 cm     TR Peak grad:   28.9 mmHg MV Decel Time: 274 msec     TR Vmax:        269.00 cm/s MV E velocity: 100.00 cm/s MV A velocity: 84.40 cm/s   SHUNTS MV E/A ratio:  1.18         Systemic VTI:  0.18  m                             Systemic Diam: 2.00 cm Isaias Cowman MD Electronically signed by Isaias Cowman MD Signature Date/Time: 02/06/2022/1:57:03 PM    Final    MR BRAIN WO CONTRAST  Result Date: 02/05/2022 CLINICAL DATA:  Slurred speech EXAM: MRI HEAD WITHOUT CONTRAST TECHNIQUE: Multiplanar, multiecho pulse sequences of the brain and surrounding structures were obtained without intravenous contrast. COMPARISON:  No prior MRI, correlation is made with CTA head and neck FINDINGS: Brain: No restricted diffusion to suggest acute or subacute infarct. No acute hemorrhage, mass, mass effect, or midline shift. No hydrocephalus or extra-axial collection. No hemosiderin deposition to suggest remote hemorrhage. Normal pituitary and craniocervical junction. Age related cerebral atrophy. Scattered and confluent T2 hyperintense signal in the periventricular white matter and pons, likely the sequela of moderate chronic small vessel ischemic  disease. Vascular: Patent arterial flow voids. Skull and upper cervical spine: Normal marrow signal. Sinuses/Orbits: Mucous retention cysts in the maxillary sinuses. Status post bilateral lens replacements. Other: The mastoid air cells are well aerated. IMPRESSION: No acute intracranial process. No evidence of acute or subacute infarct. Electronically Signed   By: Merilyn Baba M.D.   On: 02/05/2022 22:30   CT ANGIO HEAD NECK W WO CM  Result Date: 02/05/2022 CLINICAL DATA:  Slurred speech EXAM: CT ANGIOGRAPHY HEAD AND NECK TECHNIQUE: Multidetector CT imaging of the head and neck was performed using the standard protocol during bolus administration of intravenous contrast. Multiplanar CT image reconstructions and MIPs were obtained to evaluate the vascular anatomy. Carotid stenosis measurements (when applicable) are obtained utilizing NASCET criteria, using the distal internal carotid diameter as the denominator. RADIATION DOSE REDUCTION: This exam was performed according to the departmental dose-optimization program which includes automated exposure control, adjustment of the mA and/or kV according to patient size and/or use of iterative reconstruction technique. CONTRAST:  65mL OMNIPAQUE IOHEXOL 350 MG/ML SOLN COMPARISON:  None Available. FINDINGS: CT HEAD FINDINGS Brain: No evidence of acute infarct, hemorrhage, mass, mass effect, or midline shift. No hydrocephalus or extra-axial fluid collection. Vascular: No hyperdense vessel. Atherosclerotic calcifications in the intracranial carotid and vertebral arteries. Skull: Normal. Negative for fracture or focal lesion. Sinuses/Orbits: Mucosal thickening in the anterior ethmoid air cells and right frontal sinus. Status post bilateral lens replacements. Other: The mastoid air cells are well aerated. CTA NECK FINDINGS Aortic arch: Two-vessel arch with a common origin of the brachiocephalic and left common carotid arteries. Imaged portion shows no evidence of aneurysm  or dissection. No significant stenosis of the major arch vessel origins. Mild aortic atherosclerosis. Right carotid system: No evidence of dissection, occlusion, or hemodynamically significant stenosis (greater than 50%). Atherosclerotic disease at the bifurcation and in the proximal ICA is not hemodynamically significant. Left carotid system: No evidence of dissection, occlusion, or hemodynamically significant stenosis (greater than 50%). Atherosclerotic disease at the bifurcation and in the proximal ICA is not hemodynamically significant. Vertebral arteries: Mild to moderate stenosis at the origin of the left vertebral artery, which is diminutive in the V1 segment, poorly opacified in the V2 segment, and non-opacified in the V3 segment. Right dominant system. Moderate to severe stenosis at the origin of the right vertebral artery, which is otherwise patent to the skull base without significant stenosis, dissection, or occlusion. Skeleton: No acute osseous abnormality. Degenerative changes in the cervical spine with retro-odontoid pannus. Prior median sternotomy. Other neck: Hypoenhancing nodules  in the thyroid, which measure up to 5 mm, for which no follow-up is indicated. (Reference: J Am Coll Radiol. 2015 Feb;12(2): 143-50) Upper chest: No focal pulmonary opacity or pleural effusion. Review of the MIP images confirms the above findings CTA HEAD FINDINGS Anterior circulation: Both internal carotid arteries are patent to the termini, with severe stenosis in the left cavernous and right supraclinoid ICA and moderate stenosis in the right cavernous and left supraclinoid ICA. A1 segments patent. Normal anterior communicating artery. Anterior cerebral arteries are patent to their distal aspects. No left M1 stenosis or occlusion. Severe stenosis at the origin of the anterior left M2 branch (series 10, image 63) and a more posterior left M3 branch (series 10, image [id number]). Severe stenosis in the distal right M1  (series 10, image 266). No additional significant stenosis in the right MCA branches. Posterior circulation: The left vertebral artery is occluded in the V4 segment, with possible minimal retrograde flow at the junction (series 10, image 245. The right vertebral artery is patent to the vertebrobasilar junction with severe stenosis in the mid right V4 (series 10, image 225). The right PICA is patent. The left PICA is not visualized. Moderate stenosis in the mid basilar artery (series 10, images 253-254). Superior cerebellar arteries patent proximally. Patent P1 segments. PCAs irregular but perfused to their distal aspects without stenosis. The bilateral posterior communicating arteries are not visualized. Venous sinuses: As permitted by contrast timing, patent. Anatomic variants: None significant. Review of the MIP images confirms the above findings IMPRESSION: 1. 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 The proximal left V2 is diminutive and opacification diminishes by the distal V2. Additional mild-to-moderate stenosis at the origin. 2. Severe stenosis in the left cavernous and right supraclinoid ICAs and moderate stenosis in the right cavernous and left supraclinoid ICA. 3. Severe stenosis in the distal right M1, at the origin of the anterior left M2 branch, and at the origin of a posterior left M3 branch. 4. Moderate to severe stenosis at the origin of the right vertebral artery, which is otherwise patent to the skull base. 5. No hemodynamically significant stenosis in the neck. 6. Aortic atherosclerosis. Aortic Atherosclerosis (ICD10-I70.0). These results were called by telephone at the time of interpretation on 02/05/2022 at 8:44 pm to provider [facility] , who verbally acknowledged these results. Electronically Signed   By: Wiliam KeAlison  Vasan M.D.   On: 02/05/2022 20:47     Microbiology: Results for orders placed or performed during the hospital encounter of 02/15/20  SARS CORONAVIRUS 2 (TAT 6-24 HRS) Nasopharyngeal Nasopharyngeal Swab     Status: None   Collection Time: 02/15/20 12:55 PM   Specimen: Nasopharyngeal Swab  Result Value Ref Range Status   SARS Coronavirus 2 NEGATIVE NEGATIVE Final    Comment: (NOTE) SARS-CoV-2 target nucleic acids are NOT DETECTED.  The SARS-CoV-2 RNA is generally detectable in upper and lower respiratory specimens during the acute phase of infection. Negative results do not preclude SARS-CoV-2 infection, do not rule out co-infections with other pathogens, and should not be used as the sole basis for treatment or other patient management decisions. Negative results must be combined with clinical observations, patient history, and epidemiological information. The expected result is Negative.  Fact Sheet for Patients: HairSlick.nohttps://www.fda.gov/media/138098/download  Fact Sheet for Healthcare Providers: quierodirigir.comhttps://www.fda.gov/media/138095/download  This test is not yet approved or cleared by the [place] FDA  and  has been authorized for detection and/or diagnosis of SARS-CoV-2 by FDA under an Emergency Use Authorization (EUA). This EUA will remain  in effect (meaning this test can be used) for the duration of the COVID-19 declaration under Se ction 564(b)(1) of the Act, 21 U.S.C. section 360bbb-3(b)(1), unless the authorization is terminated or revoked sooner.  Performed at [facility], [address], [place], [place] [address]     Labs: CBC: Recent Labs  Lab 02/05/22 1909  WBC 8.3  NEUTROABS 4.2  HGB 12.5*  HCT 37.8*  MCV 88.7  PLT 215   Basic Metabolic Panel: Recent Labs  Lab 02/05/22 1909  NA 134*  K 4.1  CL 103  CO2 21*  GLUCOSE 134*  BUN 24*  CREATININE 1.18  CALCIUM 9.1   Liver Function Tests: Recent Labs  Lab 02/05/22 1909  AST 22  ALT 15  ALKPHOS 61  BILITOT 0.7  PROT 6.7   ALBUMIN 3.9   CBG: Recent Labs  Lab 02/06/22 0723 02/06/22 1202  GLUCAP 115* 217*    Discharge time spent: less than 30 minutes.  Signed: Hollice Espy, MD [facility] 02/06/2022

## 2022-02-06 NOTE — IPAL (Signed)
  Interdisciplinary Goals of Care Family Meeting   Date carried out: 02/06/2022  Location of the meeting: Bedside  Member's involved: Physician and Family Member or next of kin  Durable Power of Attorney or acting medical decision maker: son,Francisco Knapp    Discussion: We discussed goals of care for Francisco Knapp .   I have reviewed medical records including EPIC notes, labs and imaging, assessed the patient and then met with patient and son to discuss major active diagnoses, plan of care, natural trajectory, prognosis, GOC, EOL wishes, disposition and options including Full code/DNI/DNR and the concept of comfort care if DNR is elected. Questions and concerns were addressed. They are  in agreement to continue current plan of care . Election for FULL CODE status pending further discussion amongst them. Son states that they had started the conversation about three months ago but needed more time.   Code status:   Code Status: Full Code   Disposition: Continue current acute care  Time spent for the meeting: [place], MD  02/06/2022, 12:26 AM

## 2022-02-06 NOTE — Progress Notes (Signed)
Patient OOF via w/c with volunteers in stable condition. Discharge information provided to both patient and caregiver by Evelena Asa, RN

## 2022-02-06 NOTE — Plan of Care (Signed)
  Problem: Education: Goal: Ability to describe self-care measures that may prevent or decrease complications (Diabetes Survival Skills Education) will improve Outcome: Progressing Goal: Individualized Educational Video(s) Outcome: Progressing   Problem: Coping: Goal: Ability to adjust to condition or change in health will improve Outcome: Progressing   Problem: Fluid Volume: Goal: Ability to maintain a balanced intake and output will improve Outcome: Progressing   Problem: Health Behavior/Discharge Planning: Goal: Ability to identify and utilize available resources and services will improve Outcome: Progressing Goal: Ability to manage health-related needs will improve Outcome: Progressing   Problem: Metabolic: Goal: Ability to maintain appropriate glucose levels will improve Outcome: Progressing   Problem: Nutritional: Goal: Maintenance of adequate nutrition will improve Outcome: Progressing Goal: Progress toward achieving an optimal weight will improve Outcome: Progressing   Problem: Skin Integrity: Goal: Risk for impaired skin integrity will decrease Outcome: Progressing   Problem: Tissue Perfusion: Goal: Adequacy of tissue perfusion will improve Outcome: Progressing   Problem: Education: Goal: Knowledge of disease or condition will improve Outcome: Progressing Goal: Knowledge of secondary prevention will improve (MUST DOCUMENT ALL) Outcome: Progressing Goal: Knowledge of patient specific risk factors will improve Elta [place] N/A or DELETE if not current risk factor) Outcome: Progressing   Problem: Ischemic Stroke/TIA Tissue Perfusion: Goal: Complications of ischemic stroke/TIA will be minimized Outcome: Progressing   Problem: Coping: Goal: Will verbalize positive feelings about self Outcome: Progressing Goal: Will identify appropriate support needs Outcome: Progressing   Problem: Health Behavior/Discharge Planning: Goal: Goals will be collaboratively established  with patient/family Outcome: Progressing   Problem: Health Behavior/Discharge Planning: Goal: Ability to manage health-related needs will improve Outcome: Progressing Goal: Goals will be collaboratively established with patient/family Outcome: Progressing   Problem: Self-Care: Goal: Ability to participate in self-care as condition permits will improve Outcome: Progressing Goal: Verbalization of feelings and concerns over difficulty with self-care will improve Outcome: Progressing Goal: Ability to communicate needs accurately will improve Outcome: Progressing   Problem: Nutrition: Goal: Risk of aspiration will decrease Outcome: Progressing Goal: Dietary intake will improve Outcome: Progressing   Problem: Education: Goal: Knowledge of General Education information will improve Description: Including pain rating scale, medication(s)/side effects and non-pharmacologic comfort measures Outcome: Progressing   Problem: Health Behavior/Discharge Planning: Goal: Ability to manage health-related needs will improve Outcome: Progressing   Problem: Clinical Measurements: Goal: Ability to maintain clinical measurements within normal limits will improve Outcome: Progressing Goal: Will remain free from infection Outcome: Progressing Goal: Diagnostic test results will improve Outcome: Progressing Goal: Respiratory complications will improve Outcome: Progressing Goal: Cardiovascular complication will be avoided Outcome: Progressing   Problem: Elimination: Goal: Will not experience complications related to bowel motility Outcome: Progressing Goal: Will not experience complications related to urinary retention Outcome: Progressing   Problem: Coping: Goal: Level of anxiety will decrease Outcome: Progressing   Problem: Nutrition: Goal: Adequate nutrition will be maintained Outcome: Progressing   Problem: Activity: Goal: Risk for activity intolerance will decrease Outcome:  Progressing   Problem: Elimination: Goal: Will not experience complications related to bowel motility Outcome: Progressing Goal: Will not experience complications related to urinary retention Outcome: Progressing   Problem: Pain Managment: Goal: General experience of comfort will improve Outcome: Progressing   Problem: Safety: Goal: Ability to remain free from injury will improve Outcome: Progressing   Problem: Skin Integrity: Goal: Risk for impaired skin integrity will decrease Outcome: Progressing

## 2022-02-06 NOTE — Progress Notes (Addendum)
SLP Cancellation Note  Patient Details Name: Francisco Knapp MRN: [id number] DOB: June 26, 1923   Cancelled treatment:       Reason Eval/Treat Not Completed: SLP screened, no needs identified, will sign off (chart reviewed; consulted NSG then met w/ pt and Son in room)  Pt sitting in chair in room. Son present. Pt denied any difficulty swallowing and is currently on a regular diet; tolerates swallowing pills w/ water per NSG.  Pt conversed in [facility] conversation w/out expressive/receptive deficits noted; pt denied any speech-language deficits. Speech fully intelligible during conversation. Noted s/s had resolved per MD/chart note; NIH score on arrival was 0. OF NOTE: pt has some hearing loss and is being fitted for HAs currently w/ Audiologist. This has interfered slightly w/ his communication at times, per Son's report.   No further acute skilled ST services indicated as pt appears at his baseline. Encouraged him to f/u w/ his PCP if any decline in functional status is noted upon return hom to ADLs. Pt/Son agreed. NSG to reconsult if any change in status while admitted.     Orinda Kenner, MS, CCC-SLP Speech Language Pathologist Rehab Services; [place] [phone] (ascom) Maahi Lannan 02/06/2022, 11:37 AM

## 2022-02-06 NOTE — Evaluation (Addendum)
Physical Therapy Evaluation Patient Details Name: Francisco Knapp MRN: [id number] DOB: 1923/09/11 Today's Date: 02/06/2022  History of Present Illness  Pt is a 89 y/o M admitted on 02/05/22 after presenting to the ED with c/o slurred speech x 2. MRI was negative for acute processes; pt is being treated for TIA. PMH: DM, HTN, HLD, CAD s/p CABG, HOH, shingles  Clinical Impression  Pt seen for PT evaluation with pt agreeable to tx. Pt reports prior to admission he was independent without AD in the home but would use SPC when going out of the home. On this date, pt is able to complete STS transfer with independence but requires supervision for gait without AD & stair negotiation with 1-2 rails. Pt demonstrates slight R lateral lean during gait but no overt LOB; pt attributes this to not being out of bed much since arriving to hospital. Will continue to follow pt acutely to address balance with gait & stair negotiation but do not anticipate pt will require f/u upon d/c.  Addendum: Pt's son requested to speak with PT. Pt's son reports pt has actually fallen multiple times in the recent past & inquiring about HHPT f/u. Due to new information re: pt hx of falling, updated d/c recommendations to include HHPT. Team made aware.   Recommendations for follow up therapy are one component of a multi-disciplinary discharge planning process, led by the attending physician.  Recommendations may be updated based on patient status, additional functional criteria and insurance authorization.  Follow Up Recommendations Home health PT      Assistance Recommended at Discharge PRN  Patient can return home with the following  Help with stairs or ramp for entrance;Assist for transportation    Equipment Recommendations None recommended by PT  Recommendations for Other Services       Functional Status Assessment Patient has had a recent decline in their functional status and demonstrates the ability to make significant  improvements in function in a reasonable and predictable amount of time.     Precautions / Restrictions Precautions Precautions: Fall Restrictions Weight Bearing Restrictions: No      Mobility  Bed Mobility               General bed mobility comments: not tested, pt received & left sitting in recliner    Transfers Overall transfer level: Independent Equipment used: None               General transfer comment: STS from recliner without assistance without AD    Ambulation/Gait Ambulation/Gait assistance: Supervision Gait Distance (Feet): 200 Feet Assistive device: None Gait Pattern/deviations: Decreased step length - right, Decreased step length - left, Decreased stride length Gait velocity: decreased     General Gait Details: slightly increased R lateral sway; pt reports he's not walking as well as he does at home as he has not been OOB much since being in hospital  Stairs Stairs: Yes Stairs assistance: Min assist, Supervision Stair Management: One rail Right, Two rails Number of Stairs: 6 General stair comments: Pt ascends stairs with R rail with min assist upon initiation of task but able to complete with close supervision otherwise; pt with step to pattern to ascend. Pt descends stairs with step over step pattern with B rails & supervision.  Wheelchair Mobility    Modified Rankin (Stroke Patients Only)       Balance Overall balance assessment: Needs assistance Sitting-balance support: Feet supported Sitting balance-Leahy Scale: Good     Standing balance support: No  upper extremity supported, During functional activity Standing balance-Leahy Scale: Good                               Pertinent Vitals/Pain Pain Assessment Pain Assessment: No/denies pain    Home Living Family/patient expects to be discharged to:: Private residence Living Arrangements: Alone   Type of Home: House Home Access: Stairs to enter Entrance  Stairs-Rails: None Entrance Stairs-Number of Steps: 3 Alternate Level Stairs-Number of Steps: wood working shop in the basement with flight of stairs to access Home Layout: Multi-level;Laundry or work area in [place]: Kasandra Knudsen - single point      Prior Function Prior Level of Function : Independent/Modified Independent             Mobility Comments: Pt reports independence with mobility, doesn't use AD in the home but uses SPC when outside of the house. Endorses 1 fall in the past 6 months.       Hand Dominance        Extremity/Trunk Assessment   Upper Extremity Assessment Upper Extremity Assessment: Overall WFL for tasks assessed    Lower Extremity Assessment Lower Extremity Assessment: Overall WFL for tasks assessed    Cervical / Trunk Assessment Cervical / Trunk Assessment:  (forward head)  Communication   Communication: HOH  Cognition Arousal/Alertness: Awake/alert Behavior During Therapy: WFL for tasks assessed/performed Overall Cognitive Status: Within Functional Limits for tasks assessed                                 General Comments: eager to go home        General Comments      Exercises     Assessment/Plan    PT Assessment Patient needs continued PT services  PT Problem List Decreased activity tolerance;Decreased balance;Decreased mobility;Decreased knowledge of use of DME;Decreased safety awareness       PT Treatment Interventions DME instruction;Therapeutic exercise;Gait training;Balance training;Stair training;Neuromuscular re-education;Functional mobility training;Patient/family education;Therapeutic activities    PT Goals (Current goals can be found in the Care Plan section)  Acute Rehab PT Goals Patient Stated Goal: go home PT Goal Formulation: With patient Time For Goal Achievement: 02/20/22 Potential to Achieve Goals: Good    Frequency Min 2X/week     Co-evaluation               AM-PAC PT "6  Clicks" Mobility  Outcome Measure Help needed turning from your back to your side while in a flat bed without using bedrails?: None Help needed moving from lying on your back to sitting on the side of a flat bed without using bedrails?: None Help needed moving to and from a bed to a chair (including a wheelchair)?: None Help needed standing up from a chair using your arms (e.g., wheelchair or bedside chair)?: None Help needed to walk in hospital room?: A Little Help needed climbing 3-5 steps with a railing? : A Little 6 Click Score: 22    End of Session   Activity Tolerance: Patient tolerated treatment well Patient left: in chair;with call bell/phone within reach   PT Visit Diagnosis: Unsteadiness on feet (R26.81);Other abnormalities of gait and mobility (R26.89)    Time: 3267-1245 PT Time Calculation (min) (ACUTE ONLY): 9 min   Charges:   PT Evaluation $PT Eval Low Complexity: Novice,  PT, DPT 02/06/22, 1:58 PM   Sandi Mariscal 02/06/2022, 10:34 AM

## 2023-09-21 ENCOUNTER — Other Ambulatory Visit: Payer: Self-pay | Admitting: Infectious Diseases

## 2023-09-21 DIAGNOSIS — M5441 Lumbago with sciatica, right side: Secondary | ICD-10-CM

## 2023-10-04 ENCOUNTER — Ambulatory Visit
Admission: RE | Admit: 2023-10-04 | Discharge: 2023-10-04 | Disposition: A | Discharge status: Home / Self Care | Source: Ambulatory Visit | Attending: Infectious Diseases | Admitting: Infectious Diseases

## 2023-10-04 DIAGNOSIS — M5441 Lumbago with sciatica, right side: Secondary | ICD-10-CM | POA: Insufficient documentation

## 2024-01-14 ENCOUNTER — Emergency Department

## 2024-01-14 ENCOUNTER — Encounter: Payer: Self-pay | Admitting: Emergency Medicine

## 2024-01-14 ENCOUNTER — Inpatient Hospital Stay
Admission: EM | Admit: 2024-01-14 | Discharge: 2024-01-20 | DRG: 083 | Disposition: A | Discharge status: Skilled Nursing Facility | Attending: Student | Admitting: Student

## 2024-01-14 ENCOUNTER — Other Ambulatory Visit: Payer: Self-pay

## 2024-01-14 DIAGNOSIS — R402142 Coma scale, eyes open, spontaneous, at arrival to emergency department: Secondary | ICD-10-CM | POA: Diagnosis present

## 2024-01-14 DIAGNOSIS — E119 Type 2 diabetes mellitus without complications: Secondary | ICD-10-CM | POA: Diagnosis present

## 2024-01-14 DIAGNOSIS — Z951 Presence of aortocoronary bypass graft: Secondary | ICD-10-CM

## 2024-01-14 DIAGNOSIS — S2241XA Multiple fractures of ribs, right side, initial encounter for closed fracture: Secondary | ICD-10-CM | POA: Diagnosis present

## 2024-01-14 DIAGNOSIS — W010XXA Fall on same level from slipping, tripping and stumbling without subsequent striking against object, initial encounter: Secondary | ICD-10-CM | POA: Diagnosis present

## 2024-01-14 DIAGNOSIS — S51811A Laceration without foreign body of right forearm, initial encounter: Secondary | ICD-10-CM | POA: Diagnosis present

## 2024-01-14 DIAGNOSIS — S51011A Laceration without foreign body of right elbow, initial encounter: Secondary | ICD-10-CM

## 2024-01-14 DIAGNOSIS — Z66 Do not resuscitate: Secondary | ICD-10-CM | POA: Diagnosis present

## 2024-01-14 DIAGNOSIS — S065XAA Traumatic subdural hemorrhage with loss of consciousness status unknown, initial encounter: Principal | ICD-10-CM | POA: Diagnosis present

## 2024-01-14 DIAGNOSIS — Z79899 Other long term (current) drug therapy: Secondary | ICD-10-CM

## 2024-01-14 DIAGNOSIS — M6250 Muscle wasting and atrophy, not elsewhere classified, unspecified site: Secondary | ICD-10-CM | POA: Diagnosis present

## 2024-01-14 DIAGNOSIS — R402252 Coma scale, best verbal response, oriented, at arrival to emergency department: Secondary | ICD-10-CM | POA: Diagnosis present

## 2024-01-14 DIAGNOSIS — I6201 Nontraumatic acute subdural hemorrhage: Secondary | ICD-10-CM | POA: Diagnosis present

## 2024-01-14 DIAGNOSIS — E559 Vitamin D deficiency, unspecified: Secondary | ICD-10-CM | POA: Diagnosis present

## 2024-01-14 DIAGNOSIS — H9190 Unspecified hearing loss, unspecified ear: Secondary | ICD-10-CM | POA: Diagnosis present

## 2024-01-14 DIAGNOSIS — I10 Essential (primary) hypertension: Secondary | ICD-10-CM | POA: Diagnosis present

## 2024-01-14 DIAGNOSIS — Z751 Person awaiting admission to adequate facility elsewhere: Secondary | ICD-10-CM

## 2024-01-14 DIAGNOSIS — Z7984 Long term (current) use of oral hypoglycemic drugs: Secondary | ICD-10-CM

## 2024-01-14 DIAGNOSIS — E7800 Pure hypercholesterolemia, unspecified: Secondary | ICD-10-CM | POA: Diagnosis present

## 2024-01-14 DIAGNOSIS — W19XXXA Unspecified fall, initial encounter: Principal | ICD-10-CM

## 2024-01-14 DIAGNOSIS — Z8673 Personal history of transient ischemic attack (TIA), and cerebral infarction without residual deficits: Secondary | ICD-10-CM

## 2024-01-14 DIAGNOSIS — E785 Hyperlipidemia, unspecified: Secondary | ICD-10-CM | POA: Insufficient documentation

## 2024-01-14 DIAGNOSIS — Y92009 Unspecified place in unspecified non-institutional (private) residence as the place of occurrence of the external cause: Secondary | ICD-10-CM

## 2024-01-14 DIAGNOSIS — R296 Repeated falls: Secondary | ICD-10-CM | POA: Diagnosis present

## 2024-01-14 DIAGNOSIS — Z9185 Personal history of military service: Secondary | ICD-10-CM

## 2024-01-14 DIAGNOSIS — Z7982 Long term (current) use of aspirin: Secondary | ICD-10-CM

## 2024-01-14 DIAGNOSIS — R402342 Coma scale, best motor response, flexion withdrawal, at arrival to emergency department: Secondary | ICD-10-CM | POA: Diagnosis present

## 2024-01-14 DIAGNOSIS — I4891 Unspecified atrial fibrillation: Secondary | ICD-10-CM | POA: Diagnosis present

## 2024-01-14 LAB — CBC WITH DIFFERENTIAL/PLATELET
Abs Immature Granulocytes: 0.07 K/uL (ref 0.00–0.07)
Basophils Absolute: 0.1 K/uL (ref 0.0–0.1)
Basophils Relative: 0 %
Eosinophils Absolute: 0.1 K/uL (ref 0.0–0.5)
Eosinophils Relative: 1 %
HCT: 34.9 % — ABNORMAL LOW (ref 39.0–52.0)
Hemoglobin: 11.3 g/dL — ABNORMAL LOW (ref 13.0–17.0)
Immature Granulocytes: 1 %
Lymphocytes Relative: 20 %
Lymphs Abs: 2.6 K/uL (ref 0.7–4.0)
MCH: 29.8 pg (ref 26.0–34.0)
MCHC: 32.4 g/dL (ref 30.0–36.0)
MCV: 92.1 fL (ref 80.0–100.0)
Monocytes Absolute: 0.6 K/uL (ref 0.1–1.0)
Monocytes Relative: 4 %
Neutro Abs: 9.8 K/uL — ABNORMAL HIGH (ref 1.7–7.7)
Neutrophils Relative %: 74 %
Platelets: 199 K/uL (ref 150–400)
RBC: 3.79 MIL/uL — ABNORMAL LOW (ref 4.22–5.81)
RDW: 12.8 % (ref 11.5–15.5)
WBC: 13.3 K/uL — ABNORMAL HIGH (ref 4.0–10.5)
nRBC: 0 % (ref 0.0–0.2)

## 2024-01-14 LAB — BASIC METABOLIC PANEL WITH GFR
Anion gap: 12 (ref 5–15)
BUN: 23 mg/dL (ref 8–23)
CO2: 23 mmol/L (ref 22–32)
Calcium: 9 mg/dL (ref 8.9–10.3)
Chloride: 104 mmol/L (ref 98–111)
Creatinine, Ser: 1.26 mg/dL — ABNORMAL HIGH (ref 0.61–1.24)
GFR, Estimated: 52 mL/min — ABNORMAL LOW
Glucose, Bld: 180 mg/dL — ABNORMAL HIGH (ref 70–99)
Potassium: 4.6 mmol/L (ref 3.5–5.1)
Sodium: 140 mmol/L (ref 135–145)

## 2024-01-14 NOTE — ED Notes (Signed)
 Pt noted to be bleeding through his dressing again. Arm remains elevated on approx 7 pillows. Wound care performed and elbow redressed with Surgicel and Kerlex per MD request. Pt aware of impending repeat CT scan and states no further needs at this time.

## 2024-01-14 NOTE — ED Notes (Signed)
 Pts son Francisco Knapp called for an update. Permission granted by pt to discuss his care and findings today. [name] was updated by this RN and states no further questions at this time.

## 2024-01-14 NOTE — ED Notes (Signed)
 Some bleeding noted to have come through the dressing at the elbow site. Pt denies pain on elbow. Endorsing pain with deep inspiration on right shoulder blade. MD made aware

## 2024-01-14 NOTE — ED Notes (Signed)
 Patient transported to CT

## 2024-01-14 NOTE — ED Provider Notes (Signed)
 "  [facility] Provider Note    Event Date/Time   First MD Initiated Contact with Patient 01/14/24 1814     (approximate)   History   Fall   HPI  Francisco Knapp is a 89 y.o. male who presents to the emergency department today because of concerns for right forearm injury after a fall.  The patient tripped in his home.  He landed on his right forearm and hit his right forehead.  He denies any significant pain.  Did notice significant amount of bleeding to his right forearm.  He was not able to get up off the ground but he says that is normal for him these days.      Physical Exam   Triage Vital Signs: ED Triage Vitals  Encounter Vitals Group     BP 01/14/24 1820 (!) 167/86     Girls Systolic BP Percentile --      Girls Diastolic BP Percentile --      Boys Systolic BP Percentile --      Boys Diastolic BP Percentile --      Pulse Rate 01/14/24 1820 82     Resp 01/14/24 1820 18     Temp 01/14/24 1820 98.1 F (36.7 C)     Temp src --      SpO2 01/14/24 1820 100 %     Weight 01/14/24 1810 171 lb 15.3 oz (78 kg)     Height 01/14/24 1810 5' 11 (1.803 m)     Head Circumference --      Peak Flow --      Pain Score --      Pain Loc --      Pain Education --      Exclude from Growth Chart --     Most recent vital signs: Vitals:   01/14/24 1820  BP: (!) 167/86  Pulse: 82  Resp: 18  Temp: 98.1 F (36.7 C)  SpO2: 100%   General: Awake, alert, oriented. CV:  Good peripheral perfusion. Regular rate and rhythm. Resp:  Normal effort. Lungs clear. Abd:  No distention.  Other:  Abrasion and small hematoma to right forehead. Large skin tear to right forearm.    ED Results / Procedures / Treatments   Labs (all labs ordered are listed, but only abnormal results are displayed) Labs Reviewed  CBC WITH DIFFERENTIAL/PLATELET - Abnormal; Notable for the following components:      Result Value   WBC 13.3 (*)    RBC 3.79 (*)    Hemoglobin 11.3 (*)     HCT 34.9 (*)    Neutro Abs 9.8 (*)    All other components within normal limits  BASIC METABOLIC PANEL WITH GFR - Abnormal; Notable for the following components:   Glucose, Bld 180 (*)    Creatinine, Ser 1.26 (*)    GFR, Estimated 52 (*)    All other components within normal limits  URINALYSIS, ROUTINE W REFLEX MICROSCOPIC  TROPONIN T, HIGH SENSITIVITY     EKG  I, Guadalupe Eagles, attending physician, personally viewed and interpreted this EKG  EKG Time: 1819 Rate: 78 Rhythm: sinus rhythm Axis: normal Intervals: qtc 476 QRS: narrow, q waves v1 ST changes: no st elevation Impression: abnormal ekg    RADIOLOGY I independently interpreted and visualized the CT head/cervical spine. My interpretation: SDH. No fracture Radiology interpretation:  IMPRESSION:  1. Acute on subacute left parieto-occipital subdural hematoma up to 1.4 cm  thickness with local adjacent crowding  of sulci, but no midline shift or  downward mass effect.  2. Moderately advanced cerebral atrophy and small vessel disease. Chronic  arachnoid cyst left posterior fossa.  3. Small right frontolateral scalp hematoma without depressed skull fracture.  4. No acute fracture or traumatic malalignment of the cervical spine.  5. Degenerative cervical spondylosis with spinal canal stenosis and mild to  moderate spondylotic cord compression at C4-5 and C5-6.  6. osteopenia and multilevel acquired foraminal stenosis.  7. Heavy Vascular calcifications.     PROCEDURES:  Critical Care performed: Yes  CRITICAL CARE Performed by: Guadalupe Eagles   Total critical care time: 30 minutes  Critical care time was exclusive of separately billable procedures and treating other patients.  Critical care was necessary to treat or prevent imminent or life-threatening deterioration.  Critical care was time spent personally by me on the following activities: development of treatment plan with patient and/or surrogate as well  as nursing, discussions with consultants, evaluation of patient's response to treatment, examination of patient, obtaining history from patient or surrogate, ordering and performing treatments and interventions, ordering and review of laboratory studies, ordering and review of radiographic studies, pulse oximetry and re-evaluation of patient's condition.   Procedures    MEDICATIONS ORDERED IN ED: Medications - No data to display   IMPRESSION / MDM / ASSESSMENT AND PLAN / ED COURSE  I reviewed the triage vital signs and the nursing notes.                              Differential diagnosis includes, but is not limited to, laceration, skin tear, ICH, fracture  Patient's presentation is most consistent with acute presentation with potential threat to life or bodily function.   Patient presented to the emergency department today after a fall.  Patient states that he tripped.  Did suffer a large skin tear to his right forearm.  This was wrapped with Surgicel.  Additionally had abrasion to his right forehead.  CT scan of the head showed a acute on subacute subdural hematoma.  Patient without any neurofocal deficits.  Discussed with Dr. Katrina with neurosurgery who recommended 6-hour repeat imaging.  If negative would be okay to be discharged from a neurosurgical standpoint.  Patient's family matter did state that the patient has been having increased falls.  Will add on blood work, troponin and UA.  Awaiting repeat CT and troponin and UA at time of signout.     FINAL CLINICAL IMPRESSION(S) / ED DIAGNOSES   Final diagnoses:  Fall, initial encounter  SDH (subdural hematoma) (HCC)  Skin tear of right elbow without complication, initial encounter     Note:  This document was prepared using Dragon voice recognition software and may include unintentional dictation errors.    Eagles Guadalupe, MD 01/14/24 [phone]  "

## 2024-01-15 ENCOUNTER — Emergency Department

## 2024-01-15 ENCOUNTER — Inpatient Hospital Stay

## 2024-01-15 DIAGNOSIS — E785 Hyperlipidemia, unspecified: Secondary | ICD-10-CM | POA: Insufficient documentation

## 2024-01-15 DIAGNOSIS — I10 Essential (primary) hypertension: Secondary | ICD-10-CM | POA: Insufficient documentation

## 2024-01-15 DIAGNOSIS — Z8673 Personal history of transient ischemic attack (TIA), and cerebral infarction without residual deficits: Secondary | ICD-10-CM | POA: Diagnosis not present

## 2024-01-15 DIAGNOSIS — I6203 Nontraumatic chronic subdural hemorrhage: Secondary | ICD-10-CM

## 2024-01-15 DIAGNOSIS — Z7982 Long term (current) use of aspirin: Secondary | ICD-10-CM | POA: Diagnosis not present

## 2024-01-15 DIAGNOSIS — I6201 Nontraumatic acute subdural hemorrhage: Secondary | ICD-10-CM | POA: Diagnosis present

## 2024-01-15 DIAGNOSIS — S065XAA Traumatic subdural hemorrhage with loss of consciousness status unknown, initial encounter: Secondary | ICD-10-CM | POA: Diagnosis present

## 2024-01-15 DIAGNOSIS — E7800 Pure hypercholesterolemia, unspecified: Secondary | ICD-10-CM | POA: Diagnosis present

## 2024-01-15 DIAGNOSIS — M6250 Muscle wasting and atrophy, not elsewhere classified, unspecified site: Secondary | ICD-10-CM | POA: Diagnosis present

## 2024-01-15 DIAGNOSIS — Z7984 Long term (current) use of oral hypoglycemic drugs: Secondary | ICD-10-CM | POA: Diagnosis not present

## 2024-01-15 DIAGNOSIS — E559 Vitamin D deficiency, unspecified: Secondary | ICD-10-CM | POA: Diagnosis present

## 2024-01-15 DIAGNOSIS — H9190 Unspecified hearing loss, unspecified ear: Secondary | ICD-10-CM | POA: Diagnosis present

## 2024-01-15 DIAGNOSIS — I4891 Unspecified atrial fibrillation: Secondary | ICD-10-CM | POA: Diagnosis present

## 2024-01-15 DIAGNOSIS — Z66 Do not resuscitate: Secondary | ICD-10-CM | POA: Diagnosis present

## 2024-01-15 DIAGNOSIS — Z79899 Other long term (current) drug therapy: Secondary | ICD-10-CM | POA: Diagnosis not present

## 2024-01-15 DIAGNOSIS — Z9185 Personal history of military service: Secondary | ICD-10-CM | POA: Diagnosis not present

## 2024-01-15 DIAGNOSIS — R402142 Coma scale, eyes open, spontaneous, at arrival to emergency department: Secondary | ICD-10-CM | POA: Diagnosis present

## 2024-01-15 DIAGNOSIS — Y92009 Unspecified place in unspecified non-institutional (private) residence as the place of occurrence of the external cause: Secondary | ICD-10-CM | POA: Diagnosis not present

## 2024-01-15 DIAGNOSIS — S2241XA Multiple fractures of ribs, right side, initial encounter for closed fracture: Secondary | ICD-10-CM | POA: Diagnosis present

## 2024-01-15 DIAGNOSIS — W010XXA Fall on same level from slipping, tripping and stumbling without subsequent striking against object, initial encounter: Secondary | ICD-10-CM | POA: Diagnosis present

## 2024-01-15 DIAGNOSIS — R402252 Coma scale, best verbal response, oriented, at arrival to emergency department: Secondary | ICD-10-CM | POA: Diagnosis present

## 2024-01-15 DIAGNOSIS — Z751 Person awaiting admission to adequate facility elsewhere: Secondary | ICD-10-CM | POA: Diagnosis not present

## 2024-01-15 DIAGNOSIS — Z951 Presence of aortocoronary bypass graft: Secondary | ICD-10-CM | POA: Diagnosis not present

## 2024-01-15 DIAGNOSIS — R296 Repeated falls: Secondary | ICD-10-CM | POA: Diagnosis present

## 2024-01-15 DIAGNOSIS — E119 Type 2 diabetes mellitus without complications: Secondary | ICD-10-CM | POA: Diagnosis present

## 2024-01-15 DIAGNOSIS — R402342 Coma scale, best motor response, flexion withdrawal, at arrival to emergency department: Secondary | ICD-10-CM | POA: Diagnosis present

## 2024-01-15 DIAGNOSIS — S51811A Laceration without foreign body of right forearm, initial encounter: Secondary | ICD-10-CM | POA: Diagnosis present

## 2024-01-15 LAB — URINALYSIS, ROUTINE W REFLEX MICROSCOPIC
Bacteria, UA: NONE SEEN
Bilirubin Urine: NEGATIVE
Glucose, UA: 50 mg/dL — AB
Ketones, ur: NEGATIVE mg/dL
Leukocytes,Ua: NEGATIVE
Nitrite: NEGATIVE
Protein, ur: NEGATIVE mg/dL
Specific Gravity, Urine: 1.015 (ref 1.005–1.030)
Squamous Epithelial / HPF: 0 /HPF (ref 0–5)
pH: 6 (ref 5.0–8.0)

## 2024-01-15 LAB — CBC
HCT: 33.9 % — ABNORMAL LOW (ref 39.0–52.0)
Hemoglobin: 11.1 g/dL — ABNORMAL LOW (ref 13.0–17.0)
MCH: 29.8 pg (ref 26.0–34.0)
MCHC: 32.7 g/dL (ref 30.0–36.0)
MCV: 91.1 fL (ref 80.0–100.0)
Platelets: 194 K/uL (ref 150–400)
RBC: 3.72 MIL/uL — ABNORMAL LOW (ref 4.22–5.81)
RDW: 12.8 % (ref 11.5–15.5)
WBC: 10.6 K/uL — ABNORMAL HIGH (ref 4.0–10.5)
nRBC: 0 % (ref 0.0–0.2)

## 2024-01-15 LAB — GLUCOSE, CAPILLARY
Glucose-Capillary: 144 mg/dL — ABNORMAL HIGH (ref 70–99)
Glucose-Capillary: 151 mg/dL — ABNORMAL HIGH (ref 70–99)
Glucose-Capillary: 164 mg/dL — ABNORMAL HIGH (ref 70–99)
Glucose-Capillary: 169 mg/dL — ABNORMAL HIGH (ref 70–99)
Glucose-Capillary: 212 mg/dL — ABNORMAL HIGH (ref 70–99)

## 2024-01-15 LAB — TROPONIN T, HIGH SENSITIVITY: Troponin T High Sensitivity: 57 ng/L — ABNORMAL HIGH (ref 0–19)

## 2024-01-15 LAB — MRSA NEXT GEN BY PCR, NASAL: MRSA by PCR Next Gen: NOT DETECTED

## 2024-01-15 LAB — HEMOGLOBIN A1C
Hgb A1c MFr Bld: 6.9 % — ABNORMAL HIGH (ref 4.8–5.6)
Mean Plasma Glucose: 151.33 mg/dL

## 2024-01-15 LAB — BASIC METABOLIC PANEL WITH GFR
Anion gap: 10 (ref 5–15)
BUN: 21 mg/dL (ref 8–23)
CO2: 24 mmol/L (ref 22–32)
Calcium: 9 mg/dL (ref 8.9–10.3)
Chloride: 105 mmol/L (ref 98–111)
Creatinine, Ser: 1.23 mg/dL (ref 0.61–1.24)
GFR, Estimated: 53 mL/min — ABNORMAL LOW
Glucose, Bld: 183 mg/dL — ABNORMAL HIGH (ref 70–99)
Potassium: 4.4 mmol/L (ref 3.5–5.1)
Sodium: 138 mmol/L (ref 135–145)

## 2024-01-15 LAB — CBG MONITORING, ED: Glucose-Capillary: 182 mg/dL — ABNORMAL HIGH (ref 70–99)

## 2024-01-15 MED ORDER — RAMIPRIL 5 MG PO CAPS
5.0000 mg | ORAL_CAPSULE | Freq: Every day | ORAL | Status: DC
  Administered 2024-01-15 – 2024-01-20 (×6): 5 mg via ORAL
  Filled 2024-01-15 (×3): qty 1

## 2024-01-15 MED ORDER — MAGNESIUM HYDROXIDE 400 MG/5ML PO SUSP: 30.0000 mL | Freq: Every day | ORAL | Status: DC | PRN

## 2024-01-15 MED ORDER — ACETAMINOPHEN 160 MG/5ML PO SOLN: 650.0000 mg | ORAL | Status: DC | PRN

## 2024-01-15 MED ORDER — ACETAMINOPHEN 650 MG RE SUPP: 650.0000 mg | RECTAL | Status: DC | PRN

## 2024-01-15 MED ORDER — HYDRALAZINE HCL 20 MG/ML IJ SOLN: 5.0000 mg | INTRAMUSCULAR | Status: DC | PRN

## 2024-01-15 MED ORDER — SENNOSIDES-DOCUSATE SODIUM 8.6-50 MG PO TABS
1.0000 | ORAL_TABLET | Freq: Two times a day (BID) | ORAL | Status: DC
  Administered 2024-01-17 – 2024-01-18 (×4): 1 via ORAL
  Filled 2024-01-15 (×4): qty 1

## 2024-01-15 MED ORDER — TRAZODONE HCL 50 MG PO TABS: 25.0000 mg | ORAL_TABLET | Freq: Every evening | ORAL | Status: DC | PRN

## 2024-01-15 MED ORDER — SODIUM CHLORIDE 0.9 % IV SOLN: INTRAVENOUS | Status: DC

## 2024-01-15 MED ORDER — INSULIN ASPART 100 UNIT/ML IJ SOLN
0.0000 [IU] | INTRAMUSCULAR | Status: DC
  Administered 2024-01-15 (×2): 3 [IU] via SUBCUTANEOUS
  Administered 2024-01-15: 5 [IU] via SUBCUTANEOUS
  Administered 2024-01-15: 3 [IU] via SUBCUTANEOUS
  Administered 2024-01-15: 2 [IU] via SUBCUTANEOUS
  Administered 2024-01-16: 8 [IU] via SUBCUTANEOUS
  Administered 2024-01-16: 2 [IU] via SUBCUTANEOUS
  Administered 2024-01-16: 5 [IU] via SUBCUTANEOUS
  Administered 2024-01-17: 2 [IU] via SUBCUTANEOUS
  Filled 2024-01-15: qty 3
  Filled 2024-01-15 (×2): qty 2
  Filled 2024-01-15: qty 5
  Filled 2024-01-15: qty 2
  Filled 2024-01-15: qty 5
  Filled 2024-01-15 (×2): qty 3
  Filled 2024-01-15: qty 8

## 2024-01-15 MED ORDER — METOPROLOL SUCCINATE ER 25 MG PO TB24
25.0000 mg | ORAL_TABLET | Freq: Every day | ORAL | Status: DC
  Administered 2024-01-15 – 2024-01-20 (×6): 25 mg via ORAL
  Filled 2024-01-15 (×3): qty 1

## 2024-01-15 MED ORDER — STROKE: EARLY STAGES OF RECOVERY BOOK: Freq: Once | Status: DC

## 2024-01-15 MED ORDER — ORAL CARE MOUTH RINSE: 15.0000 mL | OROMUCOSAL | Status: DC | PRN

## 2024-01-15 MED ORDER — ACETAMINOPHEN 325 MG RE SUPP: 650.0000 mg | Freq: Four times a day (QID) | RECTAL | Status: DC | PRN

## 2024-01-15 MED ORDER — ONDANSETRON HCL 4 MG/2ML IJ SOLN: 4.0000 mg | Freq: Four times a day (QID) | INTRAMUSCULAR | Status: DC | PRN

## 2024-01-15 MED ORDER — ACETAMINOPHEN 325 MG PO TABS: 650.0000 mg | ORAL_TABLET | Freq: Four times a day (QID) | ORAL | Status: DC | PRN

## 2024-01-15 MED ORDER — CHLORHEXIDINE GLUCONATE CLOTH 2 % EX PADS
6.0000 | MEDICATED_PAD | Freq: Every day | CUTANEOUS | Status: DC
  Administered 2024-01-15 – 2024-01-20 (×5): 6 via TOPICAL

## 2024-01-15 MED ORDER — ACETAMINOPHEN 325 MG PO TABS
650.0000 mg | ORAL_TABLET | ORAL | Status: DC | PRN
  Administered 2024-01-16: 650 mg via ORAL
  Filled 2024-01-15: qty 2

## 2024-01-15 MED ORDER — ONDANSETRON HCL 4 MG PO TABS: 4.0000 mg | ORAL_TABLET | Freq: Four times a day (QID) | ORAL | Status: DC | PRN

## 2024-01-15 MED ORDER — SIMVASTATIN 20 MG PO TABS
40.0000 mg | ORAL_TABLET | Freq: Every day | ORAL | Status: DC
  Administered 2024-01-15 – 2024-01-19 (×5): 40 mg via ORAL
  Filled 2024-01-15 (×3): qty 2

## 2024-01-15 MED ORDER — PANTOPRAZOLE SODIUM 40 MG IV SOLR
40.0000 mg | Freq: Every day | INTRAVENOUS | Status: DC
  Administered 2024-01-15: 40 mg via INTRAVENOUS
  Filled 2024-01-15: qty 10

## 2024-01-15 NOTE — Evaluation (Signed)
 Speech Language Pathology Evaluation Patient Details Name: Francisco Knapp MRN: [id number] DOB: 03/03/1923 Today's Date: 01/15/2024 Time: 1100-1135 SLP Time Calculation (min) (ACUTE ONLY): 35 min  Problem List:  Patient Active Problem List   Diagnosis Date Noted   Acute on chronic intracranial subdural hematoma (HCC) 01/15/2024   Essential hypertension 01/15/2024   Type 2 diabetes mellitus without complications (HCC) 01/15/2024   Dyslipidemia 01/15/2024   SDH (subdural hematoma) (HCC) 01/15/2024   TIA (transient ischemic attack) 02/05/2022   Hypertension 02/05/2022   Diabetes mellitus without complication (HCC) 02/05/2022   Hypercholesteremia 02/05/2022   Sensorineural hearing loss, bilateral 02/05/2022   CAD s/p CABG 2011 02/05/2022   CAD (coronary artery disease), autologous vein bypass graft 05/24/2013   Past Medical History:  Past Medical History:  Diagnosis Date   Diabetes mellitus without complication (HCC)    Type 2   HOH (hard of hearing)    Hypercholesteremia    Hypertension    Shingles    HX of   Past Surgical History:  Past Surgical History:  Procedure Laterality Date   CARDIAC CATHETERIZATION     CARDIAC SURGERY     CATARACT EXTRACTION W/PHACO Left 01/29/2020   Procedure: CATARACT EXTRACTION PHACO AND INTRAOCULAR LENS PLACEMENT (IOC) LEFT DIABETIC;  Surgeon: Myrna Adine Anes, MD;  Location: [facility];  Service: Ophthalmology;  Laterality: Left;  Diabetic - oral meds   CATARACT EXTRACTION W/PHACO Right 02/19/2020   Procedure: CATARACT EXTRACTION PHACO AND INTRAOCULAR LENS PLACEMENT (IOC) RIGHT DIABETIC 6.71 00:59.0;  Surgeon: Myrna Adine Anes, MD;  Location: [facility];  Service: Ophthalmology;  Laterality: Right;   CORONARY ARTERY BYPASS GRAFT  2009   x4   VEIN LIGATION AND STRIPPING     HPI:  Francisco Knapp is a 89 y.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 Head CT: Stable left subdural hematoma (7 mm). Stable smaller right subdural hematoma  (2-3 mm). Stable trace rightward midline shift.  2. No new intracranial abnormality.   Assessment / Plan / Recommendation Clinical Impression  Pt seen for cognitive linguistic evaluation in the setting of acute on chronic SDH. Pt with hearing loss at baseline- hearing aids in place for session. Son present in the room. Pt with history of TIA in 2024- with noted functional cognitive communication at that time. Pt's son reports assisting pt with medication management, ordering food/groceries, and managing affairs at baseline. Assessment consisting of pt/son interview and completion of dynamic measures. Independent orientation and expressive/receptive language demonstrated during assessment. Attention limited secondary to physical discomfort and perseveration on pain. Long-term recall intact. Verbal cues (binary options and semantic cues), increased volume, and repetition bolstered cognitive processing/auditory comprehension. Motor speech grossly intact. Pt with limited awareness for current deficits- suspect impacting safety awareness and decision making. Question difference from baseline. SLP will monitor peripherally for additional acute therapy needs- recommend follow up at next venue of care. RN and MD aware of recommendations.      SLP Assessment  SLP Recommendation/Assessment: All further Speech Language Pathology needs can be addressed in the next venue of care SLP Visit Diagnosis: Cognitive communication deficit (R41.841)     Assistance Recommended at Discharge  Frequent or constant Supervision/Assistance  Functional Status Assessment Patient has had a recent decline in their functional status and demonstrates the ability to make significant improvements in function  in a reasonable and  predictable amount of time.  Frequency and Duration           SLP Evaluation Cognition  Overall Cognitive Status: Difficult to assess Arousal/Alertness: Awake/alert Orientation Level: Oriented X4 (min verbal cues for clarification of hospiral name) Attention: Sustained Sustained Attention: Impaired Sustained Attention Impairment: Verbal basic Memory:  (longterm memory intact- short term impacted by attention) Awareness: Impaired Awareness Impairment: Intellectual impairment Problem Solving:  (needs further assessment) Behaviors: Perseveration (on discomfort) Safety/Judgment: Impaired (suspect impacted by limited awareness)       Comprehension  Auditory Comprehension Overall Auditory Comprehension: Appears within functional limits for tasks assessed Visual Recognition/Discrimination Discrimination: Not tested Reading Comprehension Reading Status: Not tested    Expression Expression Primary Mode of Expression: Verbal Verbal Expression Overall Verbal Expression: Appears within functional limits for tasks assessed (able to make wants and needs known)   Oral / Motor  Oral Motor/Sensory Function Overall Oral Motor/Sensory Function: Within functional limits Motor Speech Overall Motor Speech: Appears within functional limits for tasks assessed           Francisco Garber Clapp, MS, CCC-SLP Speech Language Pathologist Rehab Services; [facility] [phone] (ascom)   Francisco Knapp 01/15/2024, 12:01 PM

## 2024-01-15 NOTE — Assessment & Plan Note (Signed)
-   The patient will be placed on supplemental coverage with NovoLog. - Will hold off metformin.

## 2024-01-15 NOTE — ED Provider Notes (Signed)
" °  Physical Exam  BP (!) 149/83 (BP Location: Left Arm)   Pulse 99   Temp 98.2 F (36.8 C) (Oral)   Resp 16   Ht 5' 11 (1.803 m)   Wt 78 kg   SpO2 99%   BMI 23.98 kg/m   Physical Exam  Procedures  .Critical Care  Performed by: Clarine Ozell LABOR, MD Authorized by: Clarine Ozell LABOR, MD   Critical care provider statement:    Critical care time (minutes):  30   Critical care time was exclusive of:  Separately billable procedures and treating other patients   Critical care was necessary to treat or prevent imminent or life-threatening deterioration of the following conditions:  CNS failure or compromise   Critical care was time spent personally by me on the following activities:  Development of treatment plan with patient or surrogate, discussions with consultants, evaluation of patient's response to treatment, examination of patient, ordering and review of laboratory studies, ordering and review of radiographic studies, ordering and performing treatments and interventions, pulse oximetry, re-evaluation of patient's condition and review of old charts   Care discussed with: admitting provider     ED Course / MDM   Clinical Course as of 01/15/24 0314  Sat Jan 15, 2024  0032 S/o from Dr. Floy - 75M w/ mechanical fall, ?mild generalized weakness - acute on subacute subdural hematoma - d/w NSGY, rec rpt CT in 6 hrs -- if stable, OK for DC from their perspective - rpt CT around 2am - skin avulsion to R forearm, now hemostatic, wound care performed  TO DO: - f/u trop - f/u rpt CT  [MM]  0235 CTH: IMPRESSION: 1. Acute , enlarging left cerebral convexity subdural hematoma, increased to 1.4 cm, with increasing mass effect and new small extension along the left tentorium, without midline shift. 2. Right frontal scalp contusion. 3. Mild cerebral atrophy, chronic microvascular ischemic disease, left posterior fossa arachnoid cyst, moderate intracranial atherosclerotic calcification,  and bilateral lens replacements. 4. These results will be called to the ordering clinician or representative by the radiologist assistant, and communication documented in the pacs or clario dashboard.   [MM]  [phone] Patient reevaluated, remains mentating well and at baseline per family at bedside.  No vomiting or seizure-like activity.  Paging neurosurgery.   [MM]  0240 D/w Dr. Katrina of NSGY - Repeat CT head in the further 6 hours (around 8am) --if stable can be discharged home from neurosurgery perspective - Keppra 500mg  bid x 7 days [MM]  0247 Patient reevaluated, discussed plan with patient and his son bedside.  Son notes that patient has recently had increased frequency of falls and this is at least his second fall within the past week though over the past couple months has had notably more falls.  Does not feel patient is safe at home and he does live alone with no nearby family that can easily help him and is asking about possible rehabilitation placement.  Will discuss with hospitalist. [MM]  [phone] Hospitalist consult order placed [MM]    Clinical Course User Index [MM] Clarine Ozell LABOR, MD   Medical Decision Making Amount and/or Complexity of Data Reviewed Labs: ordered. Radiology: ordered.  Risk Decision regarding hospitalization.          Clarine Ozell LABOR, MD 01/15/24 [phone]  "

## 2024-01-15 NOTE — Hospital Course (Signed)
 Hospital course / significant events:   HPI: Francisco Knapp is a 89 y.o. 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  He states that he has been tripping.  He denied any presyncope or syncope.  Per his son he sits in the chair for a while and has been getting muscle atrophy.  He has been sports coach.  He fell about twice over the last couple weeks with head injury.  He denies any paresthesias or new focal muscle weakness.  No fever or chills.  No nausea or vomiting or abdominal pain.  He has been having back spasms.  No chest pain or palpitations.  No cough or wheezing or dyspnea.   12/26: to ED. BP was 167/86 with otherwise normal vital signs.  Labs revealed blood glucose of 180 and creatinine 1.26, high-sensitivity troponin T of 57 CBC showed leukocytosis 13.2 with neutrophilia, hemoglobin 11.3 hematocrit 34.9 close to previous levels.  UA with remarkable for 50 glucose and otherwise negative. EKG controlled afib. CT head 19:51 --> (+)acute on subacute L parieto-occipital SDH up to 1/4 cm, no midline shift or mass effect, (+)advanced cerebral atrophy, (+)small R frontotemporal scalp hematoma.  12/27: Repeat CT in 6h per neurosurgery --> (+)enlarging SDH noted but measures at 1.4 cm again... --> per neurosurgery nothing to do at this time. Rib XR (+)rib fx. 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 Will be placed on as needed IV labetalol for optimal BP control.    No concerns based on BMI: Body mass index is 23.98 kg/m.SABRA Significantly low or high BMI is associated with higher medical risk.  Underweight - under 18  overweight - 25 to 29 obese - 30 or more Class 1 obesity: BMI of 30.0 to 34 Class 2 obesity: BMI of 35.0 to 39 Class 3 obesity: BMI of 40.0 to 49 Super Morbid Obesity: BMI 50-59 Super-super Morbid Obesity: BMI 60+ Healthy nutrition and physical activity advised as adjunct to other disease management and risk reduction treatments    DVT prophylaxis: holding w/ SDH IV fluids: no continuous IV fluids  Nutrition: carb modofied diet  Central lines / other devices: none  Code Status: DNR ACP documentation reviewed:  none on file in [facility]  [facility] needs: SNF rehab Medical barriers to dispo: none.

## 2024-01-15 NOTE — Progress Notes (Signed)
" °  Brief Progress Note (See full H&P from earlier today)   Subjective: Pt reports feeling okay this morning - weak/sore and mild headache but does not want any pain medications. 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    Objective: Relevant new results:  ft-sided acute subdural hematoma was identified. This was slightly worse on first repeat scan but stable on second repeat scan.  Physical Exam:  BP (!) 118/90 (BP Location: Left Arm)   Pulse 73   Temp 98.2 F (36.8 C) (Oral)   Resp 18   Ht 5' 11 (1.803 m)   Wt 78.2 kg   SpO2 100%   BMI 24.04 kg/m  Constitutional:  General Appearance: alert, well-developed, well-nourished, NAD Respiratory: Normal respiratory effort Breath sounds normal, no wheeze/rhonchi/rales Cardiovascular: S1/S2 normal, no murmur/rub/gallop auscultated No lower extremity edema Gastrointestinal: Nontender, no masses Musculoskeletal:  No clubbing/cyanosis of digits Neurological: No cranial nerve deficit on limited exam Psychiatric: Normal judgment/insight Normal mood and affect   Assessment/Plan changes or updates compared to H&P: Neurosurgery consult - no surgical intervention at this time, PT/OT ok to work w/ him, repeat CT outpatient in 2 weeks           "

## 2024-01-15 NOTE — Plan of Care (Signed)
" °  Problem: Education: Goal: Knowledge of disease or condition will improve Outcome: Progressing   Problem: Coping: Goal: Will verbalize positive feelings about self Outcome: Progressing   Problem: Nutrition: Goal: Dietary intake will improve Outcome: Progressing   Problem: Nutritional: Goal: Maintenance of adequate nutrition will improve Outcome: Progressing   Problem: Activity: Goal: Risk for activity intolerance will decrease Outcome: Progressing   Problem: Coping: Goal: Level of anxiety will decrease Outcome: Progressing   Problem: Elimination: Goal: Will not experience complications related to urinary retention Outcome: Progressing   Problem: Pain Managment: Goal: General experience of comfort will improve and/or be controlled Outcome: Progressing   Problem: Safety: Goal: Ability to remain free from injury will improve Outcome: Progressing   "

## 2024-01-15 NOTE — Assessment & Plan Note (Signed)
 Will continue statin therapy

## 2024-01-15 NOTE — Assessment & Plan Note (Signed)
-   Will continue antihypertensive therapy. - Will be placed on as needed IV labetalol for optimal BP control.

## 2024-01-15 NOTE — ED Notes (Signed)
 Pt updated by MD Clarine on increasing size of SDH and plan to discuss with neurosurgery for pending admission. Pt and family agreeable to this plan.

## 2024-01-15 NOTE — Evaluation (Signed)
 Physical Therapy Evaluation Patient Details Name: Francisco Knapp MRN: [id number] DOB: [birth year] Today's Date: 01/15/2024  History of Present Illness  presented to ER after mechanical fall, hitting head, in home environment; admitted for management of SDH with trace midline shift towards R.  Follow-up imaging stable; per neurosurgery, not a surgical candidate at this time.  Clinical Impression  Patient resting in bed upon arrival to room; alert and oriented, follows commands, agreeable to modified participation with session (with encouragement).  Endorses persistent pain in R back/flank (FACES 6/10); declines meds at this time.  Notable area of ecchymosis over R low back; very tender to palpation over R lower/lateral ribcage. RN/MD informed and aware.  MD to order imaging as appropriate. Bilat UE/LE strength and ROM grossly symmetrical and WFL for basic transfers and mobility; no focal weakness appreciated.  Generally cautious and guarded due to pain with movement. Currently requiring close sup for bed mobility; maintains unsupported sitting balance with mod indep.  Good midline orientation and postural control; limited movement and functional reach outside immediate BOS due to pain in R low back/flank.  Patient declined additional OOB attempts or mobility assessment this date; agreeable for progression next date.  Will continue to assess/progress as medically appropriate in subsequent session. Would benefit from skilled PT to address above deficits and promote optimal return to PLOF.; recommend post-acute PT follow up as indicated by interdisciplinary care team.          If plan is discharge home, recommend the following: A lot of help with walking and/or transfers;A lot of help with bathing/dressing/bathroom   Can travel by private vehicle   No    Equipment Recommendations Rolling walker (2 wheels);BSC/3in1  Recommendations for Other Services       Functional Status Assessment Patient  has had a recent decline in their functional status and demonstrates the ability to make significant improvements in function in a reasonable and predictable amount of time.     Precautions / Restrictions Precautions Precautions: Fall Restrictions Weight Bearing Restrictions Per Provider Order: No      Mobility  Bed Mobility Overal bed mobility: Needs Assistance Bed Mobility: Supine to Sit, Sit to Supine     Supine to sit: Supervision Sit to supine: Supervision   General bed mobility comments: increased time/effort; eager to do as much for himself as able    Transfers                   General transfer comment: patient declined this date (due to pain in R back/flank)    Ambulation/Gait               General Gait Details: patient declined this date (due to pain in R back/flank)  Stairs            Wheelchair Mobility     Tilt Bed    Modified Rankin (Stroke Patients Only)       Balance Overall balance assessment: Needs assistance Sitting-balance support: No upper extremity supported, Feet supported Sitting balance-Leahy Scale: Good Sitting balance - Comments: maintains static sitting with sup/mod indep; limited movement outside immediate BOS due to pain                                     Pertinent Vitals/Pain Pain Assessment Pain Assessment: Faces Faces Pain Scale: Hurts even more Pain Location: back, R ribs/flank Pain Descriptors / Indicators: Aching, Discomfort, Grimacing  Pain Intervention(s): Limited activity within patient's tolerance, Monitored during session, Repositioned (declined meds)    Home Living Family/patient expects to be discharged to:: Private residence Living Arrangements: Alone Available Help at Discharge: Family;Available PRN/intermittently Type of Home: House Home Access: Stairs to enter Entrance Stairs-Rails: None Entrance Stairs-Number of Steps: 3 Alternate Level Stairs-Number of Steps: wood  working shop in lower level of home; no essential needs in lower level space Home Layout: Multi-level;Laundry or work area in basement;Able to live on main level with bedroom/bathroom        Prior Function Prior Level of Function : Independent/Modified Independent             Mobility Comments: Mod indep with 4WR[employer] for household distances; does endorse at least 2-3 falls in previous six months (most related to tripping).  Does not drive; son has groceries/meals delivered; son/friends assist with community transport/errands as needed       Extremity/Trunk Assessment   Upper Extremity Assessment Upper Extremity Assessment: Overall WFL for tasks assessed (grossly at least 4/5; cautious/guarded due to R flank pain with moevment)    Lower Extremity Assessment Lower Extremity Assessment: Overall WFL for tasks assessed (grossly at least 4/5 throughout; no focal weakness; cautious and guarded due to pain in R flank with movement)       Communication   Communication Communication:  (HOH, bilat HA)    Cognition Arousal: Alert Behavior During Therapy: WFL for tasks assessed/performed   PT - Cognitive impairments: No apparent impairments                         Following commands: Intact       Cueing Cueing Techniques: Verbal cues     General Comments      Exercises     Assessment/Plan    PT Assessment Patient needs continued PT services  PT Problem List Decreased activity tolerance;Decreased safety awareness;Decreased balance;Decreased mobility;Decreased knowledge of use of DME;Decreased knowledge of precautions;Pain       PT Treatment Interventions DME instruction;Gait training;Stair training;Functional mobility training;Therapeutic activities;Therapeutic exercise;Balance training;Neuromuscular re-education;Cognitive remediation;Patient/family education    PT Goals (Current goals can be found in the Care Plan section)  Acute Rehab PT Goals Patient Stated  Goal: agreeable to session PT Goal Formulation: With patient Time For Goal Achievement: 01/29/24 Potential to Achieve Goals: Good    Frequency Min 2X/week     Co-evaluation               AM-PAC PT 6 Clicks Mobility  Outcome Measure Help needed turning from your back to your side while in a flat bed without using bedrails?: None Help needed moving from lying on your back to sitting on the side of a flat bed without using bedrails?: A Little Help needed moving to and from a bed to a chair (including a wheelchair)?: A Lot Help needed standing up from a chair using your arms (e.g., wheelchair or bedside chair)?: A Lot Help needed to walk in hospital room?: A Lot Help needed climbing 3-5 steps with a railing? : A Lot 6 Click Score: 15    End of Session   Activity Tolerance: Patient limited by pain Patient left: in bed;with call bell/phone within reach;with bed alarm set Nurse Communication: Mobility status PT Visit Diagnosis: Muscle weakness (generalized) (M62.81);Difficulty in walking, not elsewhere classified (R26.2);Other symptoms and signs involving the nervous system (M70.101)    Time: 8492-8464 PT Time Calculation (min) (ACUTE ONLY): 28 min   Charges:  PT Evaluation $PT Eval Moderate Complexity: 1 Mod PT Treatments $Therapeutic Activity: 8-22 mins PT General Charges $$ ACUTE PT VISIT: 1 Visit         Qadir Folks H. Delores, PT, DPT, NCS 01/15/2024, 4:04 PM [phone]

## 2024-01-15 NOTE — Consult Note (Signed)
 "   Referring Physician:  No referring provider defined for this encounter.  Primary Physician:  Francisco Alm SQUIBB, MD  History of Present Illness: 01/15/2024 Mr. Francisco Knapp is here today with a chief complaint of multiple falls.  He was brought to the emergency department for evaluation where a left-sided acute subdural hematoma was identified.  This was slightly worse on first repeat scan but stable on second repeat scan.  He reports a mild headache but no other symptoms.  He is extremely hard of hearing.  He lives alone.  He has little family nearby.  Review of Systems:  A 10 point review of systems is negative, except for the pertinent positives and negatives detailed in the HPI.  Past Medical History: Past Medical History:  Diagnosis Date   Diabetes mellitus without complication (HCC)    Type 2   HOH (hard of hearing)    Hypercholesteremia    Hypertension    Shingles    HX of    Past Surgical History: Past Surgical History:  Procedure Laterality Date   CARDIAC CATHETERIZATION     CARDIAC SURGERY     CATARACT EXTRACTION W/PHACO Left 01/29/2020   Procedure: CATARACT EXTRACTION PHACO AND INTRAOCULAR LENS PLACEMENT (IOC) LEFT DIABETIC;  Surgeon: Myrna Adine Anes, MD;  Location: [facility];  Service: Ophthalmology;  Laterality: Left;  Diabetic - oral meds   CATARACT EXTRACTION W/PHACO Right 02/19/2020   Procedure: CATARACT EXTRACTION PHACO AND INTRAOCULAR LENS PLACEMENT (IOC) RIGHT DIABETIC 6.71 00:59.0;  Surgeon: Myrna Adine Anes, MD;  Location: [facility];  Service: Ophthalmology;  Laterality: Right;   CORONARY ARTERY BYPASS GRAFT  2009   x4   VEIN LIGATION AND STRIPPING      Allergies: Allergies as of 01/14/2024   (No Known Allergies)    Medications: Current Medications[1]  Social History: Social History[2]  Family Medical History: History reviewed. No pertinent family history.  Physical Examination: Vitals:   01/15/24 0820  01/15/24 0825  BP:  (!) 168/83  Pulse:  87  Resp:  20  Temp: 98 F (36.7 C) 98 F (36.7 C)  SpO2:  100%    General: Patient is in no apparent distress. Attention to examination is appropriate.  Neck:   Supple.  Full range of motion.  Respiratory: Patient is breathing without any difficulty.   NEUROLOGICAL:     Awake, alert, oriented to person, place, and time.  Speech is clear and fluent.  Very HOH.  Cranial Nerves: Pupils equal round and reactive to light.  Facial tone is symmetric.  Facial sensation is symmetric. Shoulder shrug is symmetric. Tongue protrusion is midline.  There is no pronator drift.  Strength: Side Biceps Triceps Deltoid Interossei Grip Wrist Ext. Wrist Flex.  R 5 5 5 5 5 5 5   L 5 5 5 5 5 5 5    Side Iliopsoas Quads Hamstring PF DF EHL  R 5 5 5 5 5 5   L 5 5 5 5 5 5    Reflexes are 1+ and symmetric at the biceps, triceps, brachioradialis, patella and achilles.   Hoffman's is absent.   Bilateral upper and lower extremity sensation is intact to light touch.    No evidence of dysmetria noted.  Gait is untested.     Medical Decision Making  Imaging: CT Head 01/15/2024 IMPRESSION: 1. Stable left subdural hematoma (7 mm). Stable smaller right subdural hematoma (2-3 mm). Stable trace rightward midline shift. 2. No new intracranial abnormality.   Electronically signed by: Helayne  Hall MD 01/15/2024 08:30 AM EST RP Workstation: [id number]    I have personally reviewed the images and agree with the above interpretation.  Assessment and Plan: [name] is a pleasant 89 y.o. male with acute subdural hematoma.  I would not recommend urgent surgical intervention.  Have recommended physical and Occupational Therapy for safety evaluation.  He can start Lovenox  tomorrow.  I will arrange for an outpatient repeat scan in approximately 2 weeks.  His son expressed some concern about safety at home.  They will appreciate a safety evaluation in anticipation of safe  discharge potentially to a facility.      Thank you for involving me in the care of this patient.      Francisco Knapp K. Clois MD, MPHS Neurosurgery     [1]  Current Facility-Administered Medications:    [START ON 01/16/2024]  stroke: early stages of recovery book, , Does not apply, Once, Mansy, Jan A, MD   0.9 %  sodium chloride  infusion, , Intravenous, Continuous, Mansy, Jan A, MD, Last Rate: 100 mL/hr at 01/15/24 0438, New Bag at 01/15/24 0438   acetaminophen  (TYLENOL ) tablet 650 mg, 650 mg, Oral, Q4H PRN **OR** acetaminophen  (TYLENOL ) 160 MG/5ML solution 650 mg, 650 mg, Per Tube, Q4H PRN **OR** acetaminophen  (TYLENOL ) suppository 650 mg, 650 mg, Rectal, Q4H PRN, Mansy, Jan A, MD   Chlorhexidine  Gluconate Cloth 2 % PADS 6 each, 6 each, Topical, Daily, Alexander, Natalie, DO   insulin  aspart (novoLOG ) injection 0-15 Units, 0-15 Units, Subcutaneous, Q4H, Mansy, Jan A, MD   magnesium  hydroxide (MILK OF MAGNESIA) suspension 30 mL, 30 mL, Oral, Daily PRN, Mansy, Jan A, MD   metoprolol  succinate (TOPROL -XL) 24 hr tablet 25 mg, 25 mg, Oral, Daily, Mansy, Jan A, MD   ondansetron  (ZOFRAN ) tablet 4 mg, 4 mg, Oral, Q6H PRN **OR** ondansetron  (ZOFRAN ) injection 4 mg, 4 mg, Intravenous, Q6H PRN, Mansy, Jan A, MD   pantoprazole  (PROTONIX ) injection 40 mg, 40 mg, Intravenous, QHS, Mansy, Jan A, MD   ramipril  (ALTACE ) capsule 5 mg, 5 mg, Oral, Daily, Mansy, Jan A, MD   senna-docusate (Senokot-S) tablet 1 tablet, 1 tablet, Oral, BID, Mansy, Jan A, MD   simvastatin  (ZOCOR ) tablet 40 mg, 40 mg, Oral, QHS, Mansy, Jan A, MD   traZODone  (DESYREL ) tablet 25 mg, 25 mg, Oral, QHS PRN, Mansy, Madison LABOR, MD [2]  Social History Tobacco Use   Smoking status: Never   Smokeless tobacco: Never  Substance Use Topics   Alcohol use: Never   "

## 2024-01-15 NOTE — Progress Notes (Incomplete)
 " PROGRESS NOTE    Francisco Knapp   FMW:[id number] DOB: 1923/05/28  DOA: 01/14/2024 Date of Service: 01/15/2024 which is hospital day 0  PCP: Epifanio Alm SQUIBB, MD    Hospital course / significant events:   HPI: Francisco Knapp is a 89 y.o. 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  He states that he has been tripping.  He denied any presyncope or syncope.  Per his son he sits in the chair for a while and has been getting muscle atrophy.  He has been sports coach.  He fell about twice over the last couple weeks with head injury.  He denies any paresthesias or new focal muscle weakness.  No fever or chills.  No nausea or vomiting or abdominal pain.  He has been having back spasms.  No chest pain or palpitations.  No cough or wheezing or dyspnea.   12/26: to ED. BP was 167/86 with otherwise normal vital signs.  Labs revealed blood glucose of 180 and creatinine 1.26, high-sensitivity troponin T of 57 CBC showed leukocytosis 13.2 with neutrophilia, hemoglobin 11.3 hematocrit 34.9 close to previous levels.  UA with remarkable for 50 glucose and otherwise negative. EKG controlled afib. CT head 19:51 --> (+)acute on subacute L parieto-occipital SDH up to 1/4 cm, no midline shift or mass effect, (+)advanced cerebral atrophy, (+)small R frontotemporal scalp hematoma.  12/27: Repeat CT in 6h per neurosurgery --> (+)enlarging SDH noted but measures at 1.4 cm again... 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   Type 2 diabetes  mellitus without complications (HCC) - The patient will be placed on supplemental coverage with NovoLog . - Will hold off metformin .   Essential hypertension - Will continue antihypertensive therapy. - Will be placed on as needed IV labetalol for optimal BP control.    *** based on BMI: Body mass index is 23.98 kg/m.SABRA Significantly low or high BMI is associated with higher medical risk.  Underweight - under 18  overweight - 25 to 29 obese - 30 or more Class 1 obesity: BMI of 30.0 to 34 Class 2 obesity: BMI of 35.0 to 39 Class 3 obesity: BMI of 40.0 to 49 Super Morbid Obesity: BMI 50-59 Super-super Morbid Obesity: BMI 60+ Healthy nutrition and physical activity advised as adjunct to other disease management and risk reduction treatments    DVT prophylaxis: *** IV fluids: *** continuous IV fluids  Nutrition: *** Central lines / other devices: ***  Code Status: *** ACP documentation reviewed: *** none on file in [facility]  TOC needs: *** Medical barriers to dispo: ***. Expected medical readiness for discharge ***.              Subjective / Brief ROS:  Patient reports *** Denies CP/SOB.  Pain controlled.  Denies new weakness.  Tolerating diet ***.  Reports no concerns w/ urination/defecation.   Family Communication: ***    Objective Findings:  Vitals:   01/15/24 0641 01/15/24 0730 01/15/24 0820 01/15/24 0825  BP: 137/80 135/63  (!) 168/83  Pulse: 88 87  87  Resp: 15 19  20  Temp: 97.6 F (36.4 C)  98 F (36.7 C) 98 F (36.7 C)  TempSrc: Oral  Oral Oral  SpO2: 100%   100%  Weight:    78.2 kg  Height:    5' 11 (1.803 m)    Intake/Output Summary (Last 24 hours) at 01/15/2024 0834 Last data filed at 01/14/2024 2345 Gross per 24 hour  Intake --  Output 100 ml  Net -100 ml   Filed Weights   01/14/24 1810 01/15/24 0825  Weight: 78 kg 78.2 kg    Examination:  Physical Exam       Scheduled Medications:   [START ON 01/16/2024]  stroke:  early stages of recovery book   Does not apply Once   Chlorhexidine  Gluconate Cloth  6 each Topical Daily   insulin  aspart  0-15 Units Subcutaneous Q4H   metoprolol  succinate  25 mg Oral Daily   pantoprazole  (PROTONIX ) IV  40 mg Intravenous QHS   ramipril   5 mg Oral Daily   senna-docusate  1 tablet Oral BID   simvastatin   40 mg Oral QHS    Continuous Infusions:  sodium chloride  100 mL/hr at 01/15/24 0438    PRN Medications:  acetaminophen  **OR** acetaminophen  (TYLENOL ) oral liquid 160 mg/5 mL **OR** acetaminophen , magnesium  hydroxide, ondansetron  **OR** ondansetron  (ZOFRAN ) IV, traZODone   Antimicrobials from admission:  Anti-infectives (From admission, onward)    None           Data Reviewed:  I have personally reviewed the following...  CBC: Recent Labs  Lab 01/14/24 2058 01/15/24 0600  WBC 13.3* 10.6*  NEUTROABS 9.8*  --   HGB 11.3* 11.1*  HCT 34.9* 33.9*  MCV 92.1 91.1  PLT 199 194   Basic Metabolic Panel: Recent Labs  Lab 01/14/24 2058 01/15/24 0600  NA 140 138  K 4.6 4.4  CL 104 105  CO2 23 24  GLUCOSE 180* 183*  BUN 23 21  CREATININE 1.26* 1.23  CALCIUM 9.0 9.0   GFR: Estimated Creatinine Clearance: 35.7 mL/min (by C-G formula based on SCr of 1.23 mg/dL). Liver Function Tests: No results for input(s): AST, ALT, ALKPHOS, BILITOT, PROT, ALBUMIN in the last 168 hours. No results for input(s): LIPASE, AMYLASE in the last 168 hours. No results for input(s): AMMONIA in the last 168 hours. Coagulation Profile: No results for input(s): INR, PROTIME in the last 168 hours. Cardiac Enzymes: No results for input(s): CKTOTAL, CKMB, CKMBINDEX, TROPONINI in the last 168 hours. BNP (last 3 results) No results for input(s): PROBNP in the last 8760 hours. HbA1C: No results for input(s): HGBA1C in the last 72 hours. CBG: Recent Labs  Lab 01/15/24 0637 01/15/24 0827  GLUCAP 182* 169*   Lipid Profile: No results for  input(s): CHOL, HDL, LDLCALC, TRIG, CHOLHDL, LDLDIRECT in the last 72 hours. Thyroid Function Tests: No results for input(s): TSH, T4TOTAL, FREET4, T3FREE, THYROIDAB in the last 72 hours. Anemia Panel: No results for input(s): VITAMINB12, FOLATE, FERRITIN, TIBC, IRON, RETICCTPCT in the last 72 hours. Most Recent Urinalysis On File:     Component Value Date/Time   COLORURINE YELLOW (A) 01/14/2024 2341   APPEARANCEUR CLEAR (A) 01/14/2024 2341   LABSPEC 1.015 01/14/2024 2341   PHURINE 6.0 01/14/2024 2341   GLUCOSEU 50 (A) 01/14/2024 2341   HGBUR SMALL (A) 01/14/2024 2341   BILIRUBINUR NEGATIVE 01/14/2024 2341   KETONESUR NEGATIVE 01/14/2024 2341   PROTEINUR NEGATIVE 01/14/2024 2341   NITRITE NEGATIVE 01/14/2024 2341   LEUKOCYTESUR NEGATIVE 01/14/2024 2341   Sepsis  Labs: @LABRCNTIP (procalcitonin:4,lacticidven:4) Microbiology: No results found for this or any previous visit (from the past 240 hours).    Radiology Studies last 3 days: CT HEAD WO CONTRAST ( ) Result Date: 01/15/2024 EXAM: CT HEAD WITHOUT CONTRAST 01/15/2024 08:15:00 AM TECHNIQUE: CT of the head was performed without the administration of intravenous contrast. Automated exposure control, iterative reconstruction, and/or weight based adjustment of the mA/kV was utilized to reduce the radiation dose to as low as reasonably achievable. COMPARISON: Head CT 01/15/2024 01:57:00 AM and earlier. CLINICAL HISTORY: 89 year old male. Follow subdural hematoma - serial exam. Status post fall. FINDINGS: BRAIN AND VENTRICLES: Left hemisphere mixed density subdural hematoma measures up to 7 mm in thickness, stable. Small contralateral isodense right side subdural hematoma measures 2 to 3 mm in thickness, mildly redistributed but otherwise stable since initial presentation. Small volume of hyperdense blood redistributed along the tentorium is incidentally noted. Trace rightward midline shift is stable (coronal  image 20). Stable mild mass effect on the left lateral ventricle with no ventriculomegaly. Basilar cisterns remain patent. Calcified atherosclerosis at the skull base. No suspicious intracranial vascular hyperdensity. Stable gray white differentiation. No evidence of acute infarct. No hydrocephalus. ORBITS: No acute abnormality. SINUSES: No acute abnormality. SOFT TISSUES AND SKULL: Left anterior convexity broad based scalp hematoma appears stable on series 3 image 48. No skull fracture. IMPRESSION: 1. Stable left subdural hematoma (7 mm). Stable smaller right subdural hematoma (2-3 mm). Stable trace rightward midline shift. 2. No new intracranial abnormality. Electronically signed by: Helayne Hurst MD 01/15/2024 08:30 AM EST RP Workstation: [id number]   CT Head Wo Contrast Result Date: 01/15/2024 EXAM: CT HEAD WITHOUT CONTRAST 01/15/2024 02:05:47 AM TECHNIQUE: CT of the head was performed without the administration of intravenous contrast. Automated exposure control, iterative reconstruction, and/or weight based adjustment of the mA/kV was utilized to reduce the radiation dose to as low as reasonably achievable. COMPARISON: 01/14/2024 CLINICAL HISTORY: fall, SDH evaluation FINDINGS: BRAIN AND VENTRICLES: Acute subdural hematoma along left cerebral convexity measuring up to 1.4 cm, increased from prior 1.1 cm, with hyperdense blood now overlying the entire left convexity. Associated increasing mass effect with effacement of the left lateral ventricle, sulcal crowding and effacement of left parietal and occipital sulci. No midline shift. New small extension of hemorrhage along left tentorium. Left posterior fossa arachnoid cyst. Mild cerebral atrophy. Patchy periventricular white matter hypodensities consistent with chronic microvascular ischemic disease. No evidence of acute infarct. No hydrocephalus. No other extra-axial collection. ORBITS: Bilateral lens replacement noted. SINUSES: No acute abnormality. SOFT  TISSUES AND SKULL: Right frontal scalp contusion. No skull fracture. VASCULATURE: Moderate atherosclerotic calcification of carotid siphons and vertebral arteries. IMPRESSION: 1. Acute , enlarging left cerebral convexity subdural hematoma, increased to 1.4 cm, with increasing mass effect and new small extension along the left tentorium, without midline shift. 2. Right frontal scalp contusion. 3. Mild cerebral atrophy, chronic microvascular ischemic disease, left posterior fossa arachnoid cyst, moderate intracranial atherosclerotic calcification, and bilateral lens replacements. 4. These results will be called to the ordering clinician or representative by the radiologist assistant, and communication documented in the pacs or clario dashboard. Electronically signed by: Dorethia Molt MD 01/15/2024 02:15 AM EST RP Workstation: [id number]   CT Head Wo Contrast Result Date: 01/14/2024 EXAM: CT HEAD AND CERVICAL SPINE 01/14/2024 07:51:34 PM TECHNIQUE: CT of the head and cervical spine was performed without the administration of intravenous contrast. Multiplanar reformatted images are provided for review. Automated exposure control, iterative reconstruction, and/or weight based adjustment of the mA/kV was utilized  to reduce the radiation dose to as low as reasonably achievable. COMPARISON: Head CT and MRI brain, both dated 02/05/2022. CTA head and neck and reconstructions also from 1/18/. 24. CLINICAL HISTORY: Fall. FINDINGS: CT HEAD BRAIN AND VENTRICLES: There is an acute on subacute left hemispheric subdural hematoma. Hyperdense blood products measuring up to 1.4 cm in thickness on coronal reconstruction series 4, image 55 are noted along the posterolateral left parieto-occipital convexity merging with isodense blood products measuring 4 mm in thickness in the upper left insular area as measured on image 20 of axial series 2. There is local adjacent crowding of sulci, but no midline shift or downward mass effect due  to preexisting atrophy. No further intracranial hemorrhage is seen. . Again noted is moderately advanced cerebral atrophy, small vessel disease, and atrophic ventriculomegaly with relatively mild cerebellar atrophy. There is a 2.2 cm arachnoid cyst again seen laterally in the left posterior fossa without positive mass effect. There are small chronic lacunar infarcts in the left frontal deep white matter. No other old infarct is evident. No cortical-based acute infarct is seen. The carotid siphons and both distal vertebral arteries are heavily calcified with patchy basilar artery calcific plaques. No hyperdense vessel is seen. ORBITS: Old lens extractions with otherwise negative orbits. SINUSES AND MASTOIDS: No acute abnormality. SOFT TISSUES AND SKULL: Small right frontolateral scalp hematoma. No evidence of depressed skull fractures. CT CERVICAL SPINE BONES AND ALIGNMENT: Cervical spine demonstrates osteopenia without evidence of fractures or focal pathologic bone lesion. There is bone-on-bone anterior atlanto-dental joint space loss with osteophytes and degenerative cystic changes in the dens with retroodontoid pannus. Reversed cervical lordosis is again noted with minimal grade 1 degenerative anterolisthesis at the levels C2-C3, C3-C4, and C7-T1. No acute fracture or traumatic malalignment. DEGENERATIVE CHANGES: The cervical discs are chronically collapsed with bidirectional osteophytes at C4-C5, C5-C6, and C6-C7. At C4-C5 and C5-C6, this is associated with spinal canal stenosis and mild to moderate spondylotic cord compression due to posterior osteophytes, unchanged . Other levels do not show significant cord encroachment. The discs are otherwise normal in height. Moderate facet joint and uncinate hypertrophy from C2-C3 through C5-C6, with bilateral acquired foraminal stenosis most severe at C3-C4, C4-C5 and C5-C6. SOFT TISSUES: No prevertebral soft tissue swelling. No spinal canal hematoma. There is a stable  1.4 cm heterogeneous nodule in the right lobe of the thyroid gland. There is no laryngeal mass. . There are heavy calcifications in the proximal cervical ICAs (internal carotid arteries). There are no acute upper thoracic findings. IMPRESSION: 1. Acute on subacute left parieto-occipital subdural hematoma up to 1.4 cm thickness with local adjacent crowding of sulci, but no midline shift or downward mass effect. 2. Moderately advanced cerebral atrophy and small vessel disease. Chronic arachnoid cyst left posterior fossa. 3. Small right frontolateral scalp hematoma without depressed skull fracture. 4. No acute fracture or traumatic malalignment of the cervical spine. 5. Degenerative cervical spondylosis with spinal canal stenosis and mild to moderate spondylotic cord compression at C4-5 and C5-6. 6. osteopenia and multilevel acquired foraminal stenosis. 7. Heavy Vascular calcifications. 8. Results discussed over the telephone with Dr. Floy at 8:27 pm, 01/14/24 with verbal acknowledgment of findings. Electronically signed by: Francis Quam MD 01/14/2024 08:32 PM EST RP Workstation: [id number]   CT Cervical Spine Wo Contrast Result Date: 01/14/2024 EXAM: CT HEAD AND CERVICAL SPINE 01/14/2024 07:51:34 PM TECHNIQUE: CT of the head and cervical spine was performed without the administration of intravenous contrast. Multiplanar reformatted images are provided for review.  Automated exposure control, iterative reconstruction, and/or weight based adjustment of the mA/kV was utilized to reduce the radiation dose to as low as reasonably achievable. COMPARISON: Head CT and MRI brain, both dated 02/05/2022. CTA head and neck and reconstructions also from 1/18/. 24. CLINICAL HISTORY: Fall. FINDINGS: CT HEAD BRAIN AND VENTRICLES: There is an acute on subacute left hemispheric subdural hematoma. Hyperdense blood products measuring up to 1.4 cm in thickness on coronal reconstruction series 4, image 55 are noted along the  posterolateral left parieto-occipital convexity merging with isodense blood products measuring 4 mm in thickness in the upper left insular area as measured on image 20 of axial series 2. There is local adjacent crowding of sulci, but no midline shift or downward mass effect due to preexisting atrophy. No further intracranial hemorrhage is seen. . Again noted is moderately advanced cerebral atrophy, small vessel disease, and atrophic ventriculomegaly with relatively mild cerebellar atrophy. There is a 2.2 cm arachnoid cyst again seen laterally in the left posterior fossa without positive mass effect. There are small chronic lacunar infarcts in the left frontal deep white matter. No other old infarct is evident. No cortical-based acute infarct is seen. The carotid siphons and both distal vertebral arteries are heavily calcified with patchy basilar artery calcific plaques. No hyperdense vessel is seen. ORBITS: Old lens extractions with otherwise negative orbits. SINUSES AND MASTOIDS: No acute abnormality. SOFT TISSUES AND SKULL: Small right frontolateral scalp hematoma. No evidence of depressed skull fractures. CT CERVICAL SPINE BONES AND ALIGNMENT: Cervical spine demonstrates osteopenia without evidence of fractures or focal pathologic bone lesion. There is bone-on-bone anterior atlanto-dental joint space loss with osteophytes and degenerative cystic changes in the dens with retroodontoid pannus. Reversed cervical lordosis is again noted with minimal grade 1 degenerative anterolisthesis at the levels C2-C3, C3-C4, and C7-T1. No acute fracture or traumatic malalignment. DEGENERATIVE CHANGES: The cervical discs are chronically collapsed with bidirectional osteophytes at C4-C5, C5-C6, and C6-C7. At C4-C5 and C5-C6, this is associated with spinal canal stenosis and mild to moderate spondylotic cord compression due to posterior osteophytes, unchanged . Other levels do not show significant cord encroachment. The discs are  otherwise normal in height. Moderate facet joint and uncinate hypertrophy from C2-C3 through C5-C6, with bilateral acquired foraminal stenosis most severe at C3-C4, C4-C5 and C5-C6. SOFT TISSUES: No prevertebral soft tissue swelling. No spinal canal hematoma. There is a stable 1.4 cm heterogeneous nodule in the right lobe of the thyroid gland. There is no laryngeal mass. . There are heavy calcifications in the proximal cervical ICAs (internal carotid arteries). There are no acute upper thoracic findings. IMPRESSION: 1. Acute on subacute left parieto-occipital subdural hematoma up to 1.4 cm thickness with local adjacent crowding of sulci, but no midline shift or downward mass effect. 2. Moderately advanced cerebral atrophy and small vessel disease. Chronic arachnoid cyst left posterior fossa. 3. Small right frontolateral scalp hematoma without depressed skull fracture. 4. No acute fracture or traumatic malalignment of the cervical spine. 5. Degenerative cervical spondylosis with spinal canal stenosis and mild to moderate spondylotic cord compression at C4-5 and C5-6. 6. osteopenia and multilevel acquired foraminal stenosis. 7. Heavy Vascular calcifications. 8. Results discussed over the telephone with Dr. Floy at 8:27 pm, 01/14/24 with verbal acknowledgment of findings. Electronically signed by: Francis Quam MD 01/14/2024 08:32 PM EST RP Workstation: [id number]       Time spent: PIERRETTE Laneta Blunt, DO [facility] 01/15/2024, 8:34 AM    Dictation software may have been  used to generate the above note. Typos may occur and escape review in typed/dictated notes. Please contact Dr Marsa directly for clarity if needed.  Staff may message me via secure chat in Epic  but this may not receive an immediate response,  please page me for urgent matters!  If 7PM-7AM, please contact night coverage [email or web address]       "

## 2024-01-15 NOTE — H&P (Signed)
 "     [place]   PATIENT NAME: Francisco Knapp    MR#:  [id number]  DATE OF BIRTH:  [birth year]  DATE OF ADMISSION:  01/14/2024  PRIMARY CARE PHYSICIAN: Epifanio Alm SQUIBB, MD   Patient is coming from: Home  REQUESTING/REFERRING PHYSICIAN: Gordan Huxley, MD  CHIEF COMPLAINT:   Chief Complaint  Patient presents with   Fall    PT to ER via EMS from home after a mechanical fall injuring right arm with a skin tear and an abrasion to right forehead - No LOC - No blood thinners    HISTORY OF PRESENT ILLNESS:  Francisco Knapp is a 89 y.o. 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  He states that he has been tripping.  He denied any presyncope or syncope.  Per his son he sits in the chair for a while and has been getting muscle atrophy.  He has been sports coach.  He fell about twice over the last couple weeks with head injury.  He denies any paresthesias or new focal muscle weakness.  No fever or chills.  No nausea or vomiting or abdominal pain.  He has been having back spasms.  No chest pain or palpitations.  No cough or wheezing or dyspnea.  ED Course: When he came to the ER, BP was 167/86 with otherwise normal vital signs.  Labs revealed blood glucose of 180 and creatinine 1.26, high-sensitivity troponin T of 57 CBC showed leukocytosis 13.2 with neutrophilia, hemoglobin 11.3 hematocrit 34.9 close to previous levels.  UA with remarkable for 50 glucose and otherwise negative EKG as reviewed by me : EKG showed atrial fibrillation with controlled ventricular response of 75 with PVCs. Imaging:  Noncontrasted head CT scan revealed the following: 1. Acute on subacute left parieto-occipital subdural hematoma up to 1.4 cm thickness with local adjacent crowding of sulci, but no midline shift or downward  mass effect. 2. Moderately advanced cerebral atrophy and small vessel disease. Chronic arachnoid cyst left posterior fossa. 3. Small right frontolateral scalp hematoma without depressed skull fracture. 4. No acute fracture or traumatic malalignment of the cervical spine. 5. Degenerative cervical spondylosis with spinal canal stenosis and mild to moderate spondylotic cord compression at C4-5 and C5-6. 6. osteopenia and multilevel acquired foraminal stenosis. 7. Heavy Vascular calcifications.  Consult was made with Dr. Katrina who recommended repeat head CT scan in 6 hours which showed the following: 1. Acute , enlarging left cerebral convexity subdural hematoma, increased to 1.4 cm, with increasing mass effect and new small extension along the left tentorium, without midline shift. 2. Right frontal scalp contusion. 3. Mild cerebral atrophy, chronic microvascular ischemic disease, left posterior fossa arachnoid cyst, moderate intracranial atherosclerotic calcification, and bilateral lens replacements. 4. These results will be called to the ordering clinician or representative by the radiologist assistant, and communication documented in the pacs or clario dashboard.  The patient will be admitted to stepdown unit bed for further evaluation and management.   PAST MEDICAL HISTORY:   Past Medical History:  Diagnosis Date   Diabetes mellitus without complication (HCC)    Type 2   HOH (hard of hearing)    Hypercholesteremia    Hypertension    Shingles    HX of    PAST SURGICAL HISTORY:   Past Surgical History:  Procedure Laterality Date  CARDIAC CATHETERIZATION     CARDIAC SURGERY     CATARACT EXTRACTION W/PHACO Left 01/29/2020   Procedure: CATARACT EXTRACTION PHACO AND INTRAOCULAR LENS PLACEMENT (IOC) LEFT DIABETIC;  Surgeon: Myrna Adine Anes, MD;  Location: [facility];  Service: Ophthalmology;  Laterality: Left;  Diabetic - oral meds   CATARACT EXTRACTION W/PHACO  Right 02/19/2020   Procedure: CATARACT EXTRACTION PHACO AND INTRAOCULAR LENS PLACEMENT (IOC) RIGHT DIABETIC 6.71 00:59.0;  Surgeon: Myrna Adine Anes, MD;  Location: [facility];  Service: Ophthalmology;  Laterality: Right;   CORONARY ARTERY BYPASS GRAFT  2009   x4   VEIN LIGATION AND STRIPPING      SOCIAL HISTORY:   Social History   Tobacco Use   Smoking status: Never   Smokeless tobacco: Never  Substance Use Topics   Alcohol use: Never    FAMILY HISTORY:  History reviewed. No pertinent family history.  DRUG ALLERGIES:  Allergies[1]  REVIEW OF SYSTEMS:   ROS As per history of present illness. All pertinent systems were reviewed above. Constitutional, HEENT, cardiovascular, respiratory, GI, GU, musculoskeletal, neuro, psychiatric, endocrine, integumentary and hematologic systems were reviewed and are otherwise negative/unremarkable except for positive findings mentioned above in the HPI.   MEDICATIONS AT HOME:   Prior to Admission medications  Medication Sig Start Date End Date Taking? Authorizing Provider  aspirin  81 MG chewable tablet Chew 81 mg by mouth daily.    [provider]  cetirizine (ZYRTEC) 10 MG chewable tablet Chew 10 mg by mouth daily.    [provider]  metFORMIN  (GLUCOPHAGE ) 500 MG tablet Take 1 tablet (500 mg total) by mouth 2 (two) times daily with a meal. 02/06/22 02/06/23  Krishnan, Sendil K, MD  metoprolol  succinate (TOPROL -XL) 25 MG 24 hr tablet Take 25 mg by mouth daily. 03/18/18   [provider]  Multiple Vitamin (MULTIVITAMIN WITH MINERALS) TABS tablet Take 1 tablet by mouth daily. Patient not taking: Reported on 02/06/2022    [provider]  ramipril  (ALTACE ) 5 MG capsule Take 5 mg by mouth daily. 03/18/18   [provider]  simvastatin  (ZOCOR ) 40 MG tablet Take 40 mg by mouth Nightly. 03/18/18   [provider]      VITAL SIGNS:  Blood pressure (!) 152/83, pulse 90, temperature 98.2 F  (36.8 C), temperature source Oral, resp. rate 15, height 5' 11 (1.803 m), weight 78 kg, SpO2 99%.  PHYSICAL EXAMINATION:  Physical Exam  GENERAL:  89 y.o.-year-old very pleasant Caucasian male patient lying in the bed with no acute distress.  EYES: Pupils equal, round, reactive to light and accommodation. No scleral icterus. Extraocular muscles intact.  HEENT: Head with right temporoparietal contusion, normocephalic. Oropharynx and nasopharynx clear.  NECK:  Supple, no jugular venous distention. No thyroid enlargement, no tenderness.  LUNGS: Normal breath sounds bilaterally, no wheezing, rales,rhonchi or crepitation. No use of accessory muscles of respiration.  CARDIOVASCULAR: Regular rate and rhythm, S1, S2 normal. No murmurs, rubs, or gallops.  ABDOMEN: Soft, nondistended, nontender. Bowel sounds present. No organomegaly or mass.  EXTREMITIES: No pedal edema, cyanosis, or clubbing.  NEUROLOGIC: Cranial nerves II through XII are intact apart from hearing loss. Muscle strength 5/5 in all extremities. Sensation intact. Gait not checked.  PSYCHIATRIC: The patient is alert and oriented x 3.  Normal affect and good eye contact. SKIN: No obvious rash, lesion, or ulcer.   LABORATORY PANEL:   CBC Recent Labs  Lab 01/14/24 2058  WBC 13.3*  HGB 11.3*  HCT 34.9*  PLT 199   ------------------------------------------------------------------------------------------------------------------  Chemistries  Recent Labs  Lab 01/14/24 2058  NA 140  K 4.6  CL 104  CO2 23  GLUCOSE 180*  BUN 23  CREATININE 1.26*  CALCIUM 9.0   ------------------------------------------------------------------------------------------------------------------  Cardiac Enzymes No results for input(s): TROPONINI in the last 168 hours. ------------------------------------------------------------------------------------------------------------------  RADIOLOGY:  CT Head Wo Contrast Result Date:  01/15/2024 EXAM: CT HEAD WITHOUT CONTRAST 01/15/2024 02:05:47 AM TECHNIQUE: CT of the head was performed without the administration of intravenous contrast. Automated exposure control, iterative reconstruction, and/or weight based adjustment of the mA/kV was utilized to reduce the radiation dose to as low as reasonably achievable. COMPARISON: 01/14/2024 CLINICAL HISTORY: fall, [facility] evaluation FINDINGS: BRAIN AND VENTRICLES: Acute subdural hematoma along left cerebral convexity measuring up to 1.4 cm, increased from prior 1.1 cm, with hyperdense blood now overlying the entire left convexity. Associated increasing mass effect with effacement of the left lateral ventricle, sulcal crowding and effacement of left parietal and occipital sulci. No midline shift. New small extension of hemorrhage along left tentorium. Left posterior fossa arachnoid cyst. Mild cerebral atrophy. Patchy periventricular white matter hypodensities consistent with chronic microvascular ischemic disease. No evidence of acute infarct. No hydrocephalus. No other extra-axial collection. ORBITS: Bilateral lens replacement noted. SINUSES: No acute abnormality. SOFT TISSUES AND SKULL: Right frontal scalp contusion. No skull fracture. VASCULATURE: Moderate atherosclerotic calcification of carotid siphons and vertebral arteries. IMPRESSION: 1. Acute , enlarging left cerebral convexity subdural hematoma, increased to 1.4 cm, with increasing mass effect and new small extension along the left tentorium, without midline shift. 2. Right frontal scalp contusion. 3. Mild cerebral atrophy, chronic microvascular ischemic disease, left posterior fossa arachnoid cyst, moderate intracranial atherosclerotic calcification, and bilateral lens replacements. 4. These results will be called to the ordering clinician or representative by the radiologist assistant, and communication documented in the pacs or clario dashboard. Electronically signed by: Dorethia Molt MD  01/15/2024 02:15 AM EST RP Workstation: [id number]   CT Head Wo Contrast Result Date: 01/14/2024 EXAM: CT HEAD AND CERVICAL SPINE 01/14/2024 07:51:34 PM TECHNIQUE: CT of the head and cervical spine was performed without the administration of intravenous contrast. Multiplanar reformatted images are provided for review. Automated exposure control, iterative reconstruction, and/or weight based adjustment of the mA/kV was utilized to reduce the radiation dose to as low as reasonably achievable. COMPARISON: Head CT and MRI brain, both dated 02/05/2022. CTA head and neck and reconstructions also from 1/18/. 24. CLINICAL HISTORY: Fall. FINDINGS: CT HEAD BRAIN AND VENTRICLES: There is an acute on subacute left hemispheric subdural hematoma. Hyperdense blood products measuring up to 1.4 cm in thickness on coronal reconstruction series 4, image 55 are noted along the posterolateral left parieto-occipital convexity merging with isodense blood products measuring 4 mm in thickness in the upper left insular area as measured on image 20 of axial series 2. There is local adjacent crowding of sulci, but no midline shift or downward mass effect due to preexisting atrophy. No further intracranial hemorrhage is seen. . Again noted is moderately advanced cerebral atrophy, small vessel disease, and atrophic ventriculomegaly with relatively mild cerebellar atrophy. There is a 2.2 cm arachnoid cyst again seen laterally in the left posterior fossa without positive mass effect. There are small chronic lacunar infarcts in the left frontal deep white matter. No other old infarct is evident. No cortical-based acute infarct is seen. The carotid siphons and both distal vertebral arteries are heavily calcified with patchy basilar artery calcific plaques. No hyperdense vessel is seen. ORBITS:  Old lens extractions with otherwise negative orbits. SINUSES AND MASTOIDS: No acute abnormality. SOFT TISSUES AND SKULL: Small right frontolateral scalp  hematoma. No evidence of depressed skull fractures. CT CERVICAL SPINE BONES AND ALIGNMENT: Cervical spine demonstrates osteopenia without evidence of fractures or focal pathologic bone lesion. There is bone-on-bone anterior atlanto-dental joint space loss with osteophytes and degenerative cystic changes in the dens with retroodontoid pannus. Reversed cervical lordosis is again noted with minimal grade 1 degenerative anterolisthesis at the levels C2-C3, C3-C4, and C7-T1. No acute fracture or traumatic malalignment. DEGENERATIVE CHANGES: The cervical discs are chronically collapsed with bidirectional osteophytes at C4-C5, C5-C6, and C6-C7. At C4-C5 and C5-C6, this is associated with spinal canal stenosis and mild to moderate spondylotic cord compression due to posterior osteophytes, unchanged . Other levels do not show significant cord encroachment. The discs are otherwise normal in height. Moderate facet joint and uncinate hypertrophy from C2-C3 through C5-C6, with bilateral acquired foraminal stenosis most severe at C3-C4, C4-C5 and C5-C6. SOFT TISSUES: No prevertebral soft tissue swelling. No spinal canal hematoma. There is a stable 1.4 cm heterogeneous nodule in the right lobe of the thyroid gland. There is no laryngeal mass. . There are heavy calcifications in the proximal cervical ICAs (internal carotid arteries). There are no acute upper thoracic findings. IMPRESSION: 1. Acute on subacute left parieto-occipital subdural hematoma up to 1.4 cm thickness with local adjacent crowding of sulci, but no midline shift or downward mass effect. 2. Moderately advanced cerebral atrophy and small vessel disease. Chronic arachnoid cyst left posterior fossa. 3. Small right frontolateral scalp hematoma without depressed skull fracture. 4. No acute fracture or traumatic malalignment of the cervical spine. 5. Degenerative cervical spondylosis with spinal canal stenosis and mild to moderate spondylotic cord compression at C4-5  and C5-6. 6. osteopenia and multilevel acquired foraminal stenosis. 7. Heavy Vascular calcifications. 8. Results discussed over the telephone with Dr. Floy at 8:27 pm, 01/14/24 with verbal acknowledgment of findings. Electronically signed by: Francis Quam MD 01/14/2024 08:32 PM EST RP Workstation: [id number]   CT Cervical Spine Wo Contrast Result Date: 01/14/2024 EXAM: CT HEAD AND CERVICAL SPINE 01/14/2024 07:51:34 PM TECHNIQUE: CT of the head and cervical spine was performed without the administration of intravenous contrast. Multiplanar reformatted images are provided for review. Automated exposure control, iterative reconstruction, and/or weight based adjustment of the mA/kV was utilized to reduce the radiation dose to as low as reasonably achievable. COMPARISON: Head CT and MRI brain, both dated 02/05/2022. CTA head and neck and reconstructions also from 1/18/. 24. CLINICAL HISTORY: Fall. FINDINGS: CT HEAD BRAIN AND VENTRICLES: There is an acute on subacute left hemispheric subdural hematoma. Hyperdense blood products measuring up to 1.4 cm in thickness on coronal reconstruction series 4, image 55 are noted along the posterolateral left parieto-occipital convexity merging with isodense blood products measuring 4 mm in thickness in the upper left insular area as measured on image 20 of axial series 2. There is local adjacent crowding of sulci, but no midline shift or downward mass effect due to preexisting atrophy. No further intracranial hemorrhage is seen. . Again noted is moderately advanced cerebral atrophy, small vessel disease, and atrophic ventriculomegaly with relatively mild cerebellar atrophy. There is a 2.2 cm arachnoid cyst again seen laterally in the left posterior fossa without positive mass effect. There are small chronic lacunar infarcts in the left frontal deep white matter. No other old infarct is evident. No cortical-based acute infarct is seen. The carotid siphons and both distal  vertebral arteries  are heavily calcified with patchy basilar artery calcific plaques. No hyperdense vessel is seen. ORBITS: Old lens extractions with otherwise negative orbits. SINUSES AND MASTOIDS: No acute abnormality. SOFT TISSUES AND SKULL: Small right frontolateral scalp hematoma. No evidence of depressed skull fractures. CT CERVICAL SPINE BONES AND ALIGNMENT: Cervical spine demonstrates osteopenia without evidence of fractures or focal pathologic bone lesion. There is bone-on-bone anterior atlanto-dental joint space loss with osteophytes and degenerative cystic changes in the dens with retroodontoid pannus. Reversed cervical lordosis is again noted with minimal grade 1 degenerative anterolisthesis at the levels C2-C3, C3-C4, and C7-T1. No acute fracture or traumatic malalignment. DEGENERATIVE CHANGES: The cervical discs are chronically collapsed with bidirectional osteophytes at C4-C5, C5-C6, and C6-C7. At C4-C5 and C5-C6, this is associated with spinal canal stenosis and mild to moderate spondylotic cord compression due to posterior osteophytes, unchanged . Other levels do not show significant cord encroachment. The discs are otherwise normal in height. Moderate facet joint and uncinate hypertrophy from C2-C3 through C5-C6, with bilateral acquired foraminal stenosis most severe at C3-C4, C4-C5 and C5-C6. SOFT TISSUES: No prevertebral soft tissue swelling. No spinal canal hematoma. There is a stable 1.4 cm heterogeneous nodule in the right lobe of the thyroid gland. There is no laryngeal mass. . There are heavy calcifications in the proximal cervical ICAs (internal carotid arteries). There are no acute upper thoracic findings. IMPRESSION: 1. Acute on subacute left parieto-occipital subdural hematoma up to 1.4 cm thickness with local adjacent crowding of sulci, but no midline shift or downward mass effect. 2. Moderately advanced cerebral atrophy and small vessel disease. Chronic arachnoid cyst left posterior  fossa. 3. Small right frontolateral scalp hematoma without depressed skull fracture. 4. No acute fracture or traumatic malalignment of the cervical spine. 5. Degenerative cervical spondylosis with spinal canal stenosis and mild to moderate spondylotic cord compression at C4-5 and C5-6. 6. osteopenia and multilevel acquired foraminal stenosis. 7. Heavy Vascular calcifications. 8. Results discussed over the telephone with Dr. Floy at 8:27 pm, 01/14/24 with verbal acknowledgment of findings. Electronically signed by: Francis Quam MD 01/14/2024 08:32 PM EST RP Workstation: [id number]      IMPRESSION AND PLAN:  Assessment and Plan: * Acute on chronic intracranial subdural hematoma (HCC) - The patient will be admitted to stepdown unit bed. - We will follow neurochecks per [facility] protocol. - Aspirin  will be stopped. - The patient is currently alert and oriented x 3 and cooperative. - Neurosurgery consult will be obtained. - Follow-up noncontrasted head CT scan will be obtained at 8:00 AM.  Dyslipidemia - Will continue statin therapy.  Type 2 diabetes mellitus without complications (HCC) - The patient will be placed on supplemental coverage with NovoLog . - Will hold off metformin .  Essential hypertension - Will continue antihypertensive therapy. - Will be placed on as needed IV labetalol for optimal BP control.     DVT prophylaxis:SCDs Advanced Care Planning:  Code Status: The patient is DNR and DNI. Family Communication:  The plan of care was discussed in details with the patient (and family). I answered all questions. The patient agreed to proceed with the above mentioned plan. Further management will depend upon hospital course. Disposition Plan: Back to previous home environment Consults called: Neurosurgery All the records are reviewed and case discussed with ED provider.  Status is: Inpatient  At the time of the admission, it appears that the appropriate admission status for this  patient is inpatient.  This is judged to be reasonable and necessary in order to provide  the required intensity of service to ensure the patient's safety given the presenting symptoms, physical exam findings and initial radiographic and laboratory data in the context of comorbid conditions.  The patient requires inpatient status due to high intensity of service, high risk of further deterioration and high frequency of surveillance required.  I certify that at the time of admission, it is my clinical judgment that the patient will require inpatient hospital care extending more than 2 midnights.                            Dispo: The patient is from: Home              Anticipated d/c is to: Home              Patient currently is not medically stable to d/c.              Difficult to place patient: No  Madison DELENA Peaches M.D on 01/15/2024 at 6:08 AM  [facility]   From 7 PM-7 AM, contact night-coverage [email or web address]  CC: Primary care physician; Epifanio Alm SQUIBB, MD     [1] No Known Allergies  "

## 2024-01-15 NOTE — Assessment & Plan Note (Addendum)
-   The patient will be admitted to stepdown unit bed. - We will follow neurochecks per [facility] protocol. - Aspirin  will be stopped. - The patient is currently alert and oriented x 3 and cooperative. - Neurosurgery consult will be obtained. - Follow-up noncontrasted head CT scan will be obtained at 8:00 AM.

## 2024-01-15 NOTE — Plan of Care (Signed)
" °  Problem: Education: Goal: Knowledge of disease or condition will improve Outcome: Progressing   Problem: Intracerebral Hemorrhage Tissue Perfusion: Goal: Complications of Intracerebral Hemorrhage will be minimized Outcome: Progressing   Problem: Coping: Goal: Will verbalize positive feelings about self Outcome: Progressing   Problem: Nutrition: Goal: Risk of aspiration will decrease Outcome: Progressing   Problem: Education: Goal: Ability to describe self-care measures that may prevent or decrease complications (Diabetes Survival Skills Education) will improve Outcome: Progressing   Problem: Coping: Goal: Ability to adjust to condition or change in health will improve Outcome: Progressing   Problem: Metabolic: Goal: Ability to maintain appropriate glucose levels will improve Outcome: Progressing   Problem: Nutritional: Goal: Maintenance of adequate nutrition will improve Outcome: Progressing   Problem: Skin Integrity: Goal: Risk for impaired skin integrity will decrease Outcome: Progressing   Problem: Tissue Perfusion: Goal: Adequacy of tissue perfusion will improve Outcome: Progressing   Problem: Activity: Goal: Risk for activity intolerance will decrease Outcome: Progressing   Problem: Nutrition: Goal: Adequate nutrition will be maintained Outcome: Progressing   Problem: Coping: Goal: Level of anxiety will decrease Outcome: Progressing   Problem: Safety: Goal: Ability to remain free from injury will improve Outcome: Progressing   Problem: Skin Integrity: Goal: Risk for impaired skin integrity will decrease Outcome: Progressing   "

## 2024-01-16 DIAGNOSIS — I6203 Nontraumatic chronic subdural hemorrhage: Secondary | ICD-10-CM | POA: Diagnosis not present

## 2024-01-16 DIAGNOSIS — I6201 Nontraumatic acute subdural hemorrhage: Secondary | ICD-10-CM | POA: Diagnosis not present

## 2024-01-16 LAB — GLUCOSE, CAPILLARY
Glucose-Capillary: 89 mg/dL (ref 70–99)
Glucose-Capillary: 142 mg/dL — ABNORMAL HIGH (ref 70–99)
Glucose-Capillary: 299 mg/dL — ABNORMAL HIGH (ref 70–99)
Glucose-Capillary: 224 mg/dL — ABNORMAL HIGH (ref 70–99)
Glucose-Capillary: 84 mg/dL (ref 70–99)

## 2024-01-16 MED ORDER — PANTOPRAZOLE SODIUM 40 MG PO TBEC
40.0000 mg | DELAYED_RELEASE_TABLET | Freq: Every day | ORAL | Status: DC
  Administered 2024-01-16 – 2024-01-19 (×4): 40 mg via ORAL
  Filled 2024-01-16 (×2): qty 1

## 2024-01-16 MED ORDER — METHOCARBAMOL 500 MG PO TABS
250.0000 mg | ORAL_TABLET | Freq: Four times a day (QID) | ORAL | Status: DC | PRN
  Administered 2024-01-16 – 2024-01-17 (×2): 500 mg via ORAL
  Filled 2024-01-16: qty 1

## 2024-01-16 MED ORDER — ENSURE PLUS HIGH PROTEIN PO LIQD
237.0000 mL | Freq: Two times a day (BID) | ORAL | Status: DC
  Administered 2024-01-17 (×2): 237 mL via ORAL

## 2024-01-16 MED ORDER — ACETAMINOPHEN 325 MG PO TABS
975.0000 mg | ORAL_TABLET | Freq: Four times a day (QID) | ORAL | Status: DC | PRN
  Administered 2024-01-16 – 2024-01-19 (×4): 975 mg via ORAL
  Filled 2024-01-16 (×2): qty 3

## 2024-01-16 MED ORDER — LIDOCAINE 5 % EX PTCH
1.0000 | MEDICATED_PATCH | Freq: Every day | CUTANEOUS | Status: DC | PRN
  Administered 2024-01-16 – 2024-01-18 (×2): 1 via TRANSDERMAL
  Filled 2024-01-16: qty 2
  Filled 2024-01-16: qty 1

## 2024-01-16 NOTE — Progress Notes (Signed)
 " PROGRESS NOTE    Francisco Knapp   FMW:[id number] DOB: 1923/09/02  DOA: 01/14/2024 Date of Service: 01/16/2024 which is hospital day 1  PCP: Epifanio Alm SQUIBB, MD    Hospital course / significant events:   HPI: Francisco Knapp is a 89 y.o. 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  He states that he has been tripping.  He denied any presyncope or syncope.  Per his son he sits in the chair for a while and has been getting muscle atrophy.  He has been sports coach.  He fell about twice over the last couple weeks with head injury.  He denies any paresthesias or new focal muscle weakness.  No fever or chills.  No nausea or vomiting or abdominal pain.  He has been having back spasms.  No chest pain or palpitations.  No cough or wheezing or dyspnea.   12/26: to ED. BP was 167/86 with otherwise normal vital signs.  Labs revealed blood glucose of 180 and creatinine 1.26, high-sensitivity troponin T of 57 CBC showed leukocytosis 13.2 with neutrophilia, hemoglobin 11.3 hematocrit 34.9 close to previous levels.  UA with remarkable for 50 glucose and otherwise negative. EKG controlled afib. CT head 19:51 --> (+)acute on subacute L parieto-occipital SDH up to 1/4 cm, no midline shift or mass effect, (+)advanced cerebral atrophy, (+)small R frontotemporal scalp hematoma.  12/27: Repeat CT in 6h per neurosurgery --> (+)enlarging SDH noted but measures at 1.4 cm again... --> per neurosurgery nothing to do at this time. Rib XR (+)rib fx. 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 hold metformin    Essential hypertension Will continue home antihypertensive therapy. Will be placed on as needed IV labetalol for optimal BP control.    No concerns based on BMI: Body mass index is 23.98 kg/m.SABRA Significantly low or high BMI is associated with higher medical risk.  Underweight - under 18  overweight - 25 to 29 obese - 30 or more Class 1 obesity: BMI of 30.0 to 34 Class 2 obesity: BMI of 35.0 to 39 Class 3 obesity: BMI of 40.0 to 49 Super Morbid Obesity: BMI 50-59 Super-super Morbid Obesity: BMI 60+ Healthy nutrition and physical activity advised as adjunct to other disease management and risk reduction treatments    DVT prophylaxis: holding w/ SDH IV fluids: no continuous IV fluids  Nutrition: carb modofied diet  Central lines / other devices: none  Code Status: DNR ACP documentation reviewed:  none on file in [facility]  [facility] needs: SNF rehab Medical barriers to dispo: none.              Subjective / Brief ROS:  Patient reports feeing okay today other than his arm  laceration is bled through bandage Denies CP/SOB.  Pain controlled.  Denies new weakness.  Tolerating diet.  Reports no concerns w/ urination/defecation.   Family Communication: son at bedside on tounds    Objective Findings:  Vitals:   01/15/24 1940 01/16/24 0327 01/16/24 0746 01/16/24 1550  BP: 110/60 (!) 149/73 (!) 154/80 120/64  Pulse: 80 73 80 72  Resp:   16 16  Temp: 98.7 F (37.1 C) 98 F (36.7 C) (!) 97.3 F (36.3 C) 98.3 F (36.8 C)  TempSrc: Oral Oral Oral Oral  SpO2: 99% 99% 100%  98%  Weight:      Height:        Intake/Output Summary (Last 24 hours) at 01/16/2024 1705 Last data filed at 01/16/2024 9380 Gross per 24 hour  Intake 30 ml  Output 200 ml  Net -170 ml   Filed Weights   01/14/24 1810 01/15/24 0825  Weight: 78 kg 78.2 kg    Examination:  Physical Exam Constitutional:      General: He is not in acute distress. Cardiovascular:     Rate and Rhythm: Normal rate and regular rhythm.  Pulmonary:     Effort: Pulmonary effort is normal.     Breath sounds: Normal breath sounds.  Skin:    Comments: See photo  Neurological:     Mental Status: He is alert and oriented to person, place, and time. Mental status is at baseline.  Psychiatric:        Mood and Affect: Mood normal.        Behavior: Behavior normal.          Scheduled Medications:   Chlorhexidine  Gluconate Cloth  6 each Topical Daily   insulin  aspart  0-15 Units Subcutaneous Q4H   metoprolol  succinate  25 mg Oral Daily   pantoprazole   40 mg Oral QHS   ramipril   5 mg Oral Daily   senna-docusate  1 tablet Oral BID   simvastatin   40 mg Oral QHS    Continuous Infusions:   PRN Medications:  acetaminophen  **OR** acetaminophen  (TYLENOL ) oral liquid 160 mg/5 mL **OR** acetaminophen , hydrALAZINE , lidocaine , magnesium  hydroxide, ondansetron  **OR** ondansetron  (ZOFRAN ) IV, mouth rinse, traZODone   Antimicrobials from admission:  Anti-infectives (From admission, onward)    None           Data Reviewed:  I have personally reviewed the following...  CBC: Recent Labs  Lab 01/14/24 2058 01/15/24 0600  WBC 13.3* 10.6*  NEUTROABS 9.8*  --   HGB 11.3* 11.1*  HCT 34.9* 33.9*  MCV 92.1 91.1  PLT 199 194   Basic Metabolic Panel: Recent Labs  Lab 01/14/24 2058 01/15/24 0600  NA 140 138  K 4.6 4.4  CL 104 105  CO2 23 24  GLUCOSE 180* 183*  BUN 23 21  CREATININE 1.26* 1.23  CALCIUM 9.0 9.0   GFR: Estimated Creatinine Clearance: 35.7 mL/min (by C-G formula based on  SCr of 1.23 mg/dL). Liver Function Tests: No results for input(s): AST, ALT, ALKPHOS, BILITOT, PROT, ALBUMIN in the last 168 hours. No results for input(s): LIPASE, AMYLASE in the last 168 hours. No results for input(s): AMMONIA in the last 168 hours. Coagulation Profile: No results for input(s): INR, PROTIME in the last 168 hours. Cardiac Enzymes: No results for input(s): CKTOTAL, CKMB, CKMBINDEX, TROPONINI in the last 168 hours. BNP (last 3 results) No results for input(s): PROBNP in the last 8760 hours. HbA1C: Recent Labs    01/15/24 0600  HGBA1C 6.9*   CBG:  Recent Labs  Lab 01/15/24 2331 01/16/24 0330 01/16/24 0747 01/16/24 1158 01/16/24 1548  GLUCAP 144* 89 142* 299* 224*   Lipid Profile: No results for input(s): CHOL, HDL, LDLCALC, TRIG, CHOLHDL, LDLDIRECT in the last 72 hours. Thyroid Function Tests: No results for input(s): TSH, T4TOTAL, FREET4, T3FREE, THYROIDAB in the last 72 hours. Anemia Panel: No results for input(s): VITAMINB12, FOLATE, FERRITIN, TIBC, IRON, RETICCTPCT in the last 72 hours. Most Recent Urinalysis On File:     Component Value Date/Time   COLORURINE YELLOW (A) 01/14/2024 2341   APPEARANCEUR CLEAR (A) 01/14/2024 2341   LABSPEC 1.015 01/14/2024 2341   PHURINE 6.0 01/14/2024 2341   GLUCOSEU 50 (A) 01/14/2024 2341   HGBUR SMALL (A) 01/14/2024 2341   BILIRUBINUR NEGATIVE 01/14/2024 2341   KETONESUR NEGATIVE 01/14/2024 2341   PROTEINUR NEGATIVE 01/14/2024 2341   NITRITE NEGATIVE 01/14/2024 2341   LEUKOCYTESUR NEGATIVE 01/14/2024 2341   Sepsis Labs: @LABRCNTIP (procalcitonin:4,lacticidven:4) Microbiology: Recent Results (from the past 240 hours)  MRSA Next Gen by PCR, Nasal     Status: None   Collection Time: 01/15/24  8:32 AM   Specimen: Nasal Mucosa; Nasal Swab  Result Value Ref Range Status   MRSA by PCR Next Gen NOT DETECTED NOT DETECTED Final    Comment: (NOTE) The  GeneXpert MRSA Assay (FDA approved for NASAL specimens only), is one component of a comprehensive MRSA colonization surveillance program. It is not intended to diagnose MRSA infection nor to guide or monitor treatment for MRSA infections. Test performance is not FDA approved in patients less than 62 years old. Performed at [facility], [address], [place], [place] [address]       Radiology Studies last 3 days: DG Ribs Unilateral Right Result Date: 01/15/2024 CLINICAL DATA:  Rib pain after fall. EXAM: RIGHT RIBS - 2 VIEW COMPARISON:  None Available. FINDINGS: Minimally displaced fractures of right lateral fifth through seventh ribs. No pneumothorax or large pleural effusion. The right lung is clear. IMPRESSION: Minimally displaced fractures of right lateral fifth through seventh ribs. No pneumothorax. Electronically Signed   By: Andrea Gasman M.D.   On: 01/15/2024 17:07   CT HEAD WO CONTRAST ( ) Result Date: 01/15/2024 EXAM: CT HEAD WITHOUT CONTRAST 01/15/2024 08:15:00 AM TECHNIQUE: CT of the head was performed without the administration of intravenous contrast. Automated exposure control, iterative reconstruction, and/or weight based adjustment of the mA/kV was utilized to reduce the radiation dose to as low as reasonably achievable. COMPARISON: Head CT 01/15/2024 01:57:00 AM and earlier. CLINICAL HISTORY: 89 year old male. Follow subdural hematoma - serial exam. Status post fall. FINDINGS: BRAIN AND VENTRICLES: Left hemisphere mixed density subdural hematoma measures up to 7 mm in thickness, stable. Small contralateral isodense right side subdural hematoma measures 2 to 3 mm in thickness, mildly redistributed but otherwise stable since initial presentation. Small volume of hyperdense blood redistributed along the tentorium is incidentally noted. Trace rightward midline shift is stable (coronal image 20). Stable mild mass effect on the left lateral ventricle with no  ventriculomegaly. Basilar cisterns remain patent. Calcified atherosclerosis at the skull base. No suspicious intracranial vascular hyperdensity. Stable gray white differentiation. No evidence of acute infarct. No hydrocephalus. ORBITS: No acute abnormality. SINUSES: No acute abnormality. SOFT TISSUES AND SKULL: Left anterior convexity broad based scalp hematoma appears stable on series 3 image 48. No skull fracture. IMPRESSION: 1. Stable left subdural hematoma (7 mm). Stable smaller right subdural hematoma (2-3 mm). Stable trace rightward midline shift. 2. No new intracranial abnormality. Electronically signed  by: Helayne Hurst MD 01/15/2024 08:30 AM EST RP Workstation: [id number]   CT Head Wo Contrast Result Date: 01/15/2024 EXAM: CT HEAD WITHOUT CONTRAST 01/15/2024 02:05:47 AM TECHNIQUE: CT of the head was performed without the administration of intravenous contrast. Automated exposure control, iterative reconstruction, and/or weight based adjustment of the mA/kV was utilized to reduce the radiation dose to as low as reasonably achievable. COMPARISON: 01/14/2024 CLINICAL HISTORY: fall, SDH evaluation FINDINGS: BRAIN AND VENTRICLES: Acute subdural hematoma along left cerebral convexity measuring up to 1.4 cm, increased from prior 1.1 cm, with hyperdense blood now overlying the entire left convexity. Associated increasing mass effect with effacement of the left lateral ventricle, sulcal crowding and effacement of left parietal and occipital sulci. No midline shift. New small extension of hemorrhage along left tentorium. Left posterior fossa arachnoid cyst. Mild cerebral atrophy. Patchy periventricular white matter hypodensities consistent with chronic microvascular ischemic disease. No evidence of acute infarct. No hydrocephalus. No other extra-axial collection. ORBITS: Bilateral lens replacement noted. SINUSES: No acute abnormality. SOFT TISSUES AND SKULL: Right frontal scalp contusion. No skull fracture.  VASCULATURE: Moderate atherosclerotic calcification of carotid siphons and vertebral arteries. IMPRESSION: 1. Acute , enlarging left cerebral convexity subdural hematoma, increased to 1.4 cm, with increasing mass effect and new small extension along the left tentorium, without midline shift. 2. Right frontal scalp contusion. 3. Mild cerebral atrophy, chronic microvascular ischemic disease, left posterior fossa arachnoid cyst, moderate intracranial atherosclerotic calcification, and bilateral lens replacements. 4. These results will be called to the ordering clinician or representative by the radiologist assistant, and communication documented in the pacs or clario dashboard. Electronically signed by: Dorethia Molt MD 01/15/2024 02:15 AM EST RP Workstation: [id number]   CT Head Wo Contrast Result Date: 01/14/2024 EXAM: CT HEAD AND CERVICAL SPINE 01/14/2024 07:51:34 PM TECHNIQUE: CT of the head and cervical spine was performed without the administration of intravenous contrast. Multiplanar reformatted images are provided for review. Automated exposure control, iterative reconstruction, and/or weight based adjustment of the mA/kV was utilized to reduce the radiation dose to as low as reasonably achievable. COMPARISON: Head CT and MRI brain, both dated 02/05/2022. CTA head and neck and reconstructions also from 1/18/. 24. CLINICAL HISTORY: Fall. FINDINGS: CT HEAD BRAIN AND VENTRICLES: There is an acute on subacute left hemispheric subdural hematoma. Hyperdense blood products measuring up to 1.4 cm in thickness on coronal reconstruction series 4, image 55 are noted along the posterolateral left parieto-occipital convexity merging with isodense blood products measuring 4 mm in thickness in the upper left insular area as measured on image 20 of axial series 2. There is local adjacent crowding of sulci, but no midline shift or downward mass effect due to preexisting atrophy. No further intracranial hemorrhage is seen. .  Again noted is moderately advanced cerebral atrophy, small vessel disease, and atrophic ventriculomegaly with relatively mild cerebellar atrophy. There is a 2.2 cm arachnoid cyst again seen laterally in the left posterior fossa without positive mass effect. There are small chronic lacunar infarcts in the left frontal deep white matter. No other old infarct is evident. No cortical-based acute infarct is seen. The carotid siphons and both distal vertebral arteries are heavily calcified with patchy basilar artery calcific plaques. No hyperdense vessel is seen. ORBITS: Old lens extractions with otherwise negative orbits. SINUSES AND MASTOIDS: No acute abnormality. SOFT TISSUES AND SKULL: Small right frontolateral scalp hematoma. No evidence of depressed skull fractures. CT CERVICAL SPINE BONES AND ALIGNMENT: Cervical spine demonstrates osteopenia without evidence of fractures or focal pathologic  bone lesion. There is bone-on-bone anterior atlanto-dental joint space loss with osteophytes and degenerative cystic changes in the dens with retroodontoid pannus. Reversed cervical lordosis is again noted with minimal grade 1 degenerative anterolisthesis at the levels C2-C3, C3-C4, and C7-T1. No acute fracture or traumatic malalignment. DEGENERATIVE CHANGES: The cervical discs are chronically collapsed with bidirectional osteophytes at C4-C5, C5-C6, and C6-C7. At C4-C5 and C5-C6, this is associated with spinal canal stenosis and mild to moderate spondylotic cord compression due to posterior osteophytes, unchanged . Other levels do not show significant cord encroachment. The discs are otherwise normal in height. Moderate facet joint and uncinate hypertrophy from C2-C3 through C5-C6, with bilateral acquired foraminal stenosis most severe at C3-C4, C4-C5 and C5-C6. SOFT TISSUES: No prevertebral soft tissue swelling. No spinal canal hematoma. There is a stable 1.4 cm heterogeneous nodule in the right lobe of the thyroid gland.  There is no laryngeal mass. . There are heavy calcifications in the proximal cervical ICAs (internal carotid arteries). There are no acute upper thoracic findings. IMPRESSION: 1. Acute on subacute left parieto-occipital subdural hematoma up to 1.4 cm thickness with local adjacent crowding of sulci, but no midline shift or downward mass effect. 2. Moderately advanced cerebral atrophy and small vessel disease. Chronic arachnoid cyst left posterior fossa. 3. Small right frontolateral scalp hematoma without depressed skull fracture. 4. No acute fracture or traumatic malalignment of the cervical spine. 5. Degenerative cervical spondylosis with spinal canal stenosis and mild to moderate spondylotic cord compression at C4-5 and C5-6. 6. osteopenia and multilevel acquired foraminal stenosis. 7. Heavy Vascular calcifications. 8. Results discussed over the telephone with Dr. Floy at 8:27 pm, 01/14/24 with verbal acknowledgment of findings. Electronically signed by: Francis Quam MD 01/14/2024 08:32 PM EST RP Workstation: [id number]   CT Cervical Spine Wo Contrast Result Date: 01/14/2024 EXAM: CT HEAD AND CERVICAL SPINE 01/14/2024 07:51:34 PM TECHNIQUE: CT of the head and cervical spine was performed without the administration of intravenous contrast. Multiplanar reformatted images are provided for review. Automated exposure control, iterative reconstruction, and/or weight based adjustment of the mA/kV was utilized to reduce the radiation dose to as low as reasonably achievable. COMPARISON: Head CT and MRI brain, both dated 02/05/2022. CTA head and neck and reconstructions also from 1/18/. 24. CLINICAL HISTORY: Fall. FINDINGS: CT HEAD BRAIN AND VENTRICLES: There is an acute on subacute left hemispheric subdural hematoma. Hyperdense blood products measuring up to 1.4 cm in thickness on coronal reconstruction series 4, image 55 are noted along the posterolateral left parieto-occipital convexity merging with isodense blood  products measuring 4 mm in thickness in the upper left insular area as measured on image 20 of axial series 2. There is local adjacent crowding of sulci, but no midline shift or downward mass effect due to preexisting atrophy. No further intracranial hemorrhage is seen. . Again noted is moderately advanced cerebral atrophy, small vessel disease, and atrophic ventriculomegaly with relatively mild cerebellar atrophy. There is a 2.2 cm arachnoid cyst again seen laterally in the left posterior fossa without positive mass effect. There are small chronic lacunar infarcts in the left frontal deep white matter. No other old infarct is evident. No cortical-based acute infarct is seen. The carotid siphons and both distal vertebral arteries are heavily calcified with patchy basilar artery calcific plaques. No hyperdense vessel is seen. ORBITS: Old lens extractions with otherwise negative orbits. SINUSES AND MASTOIDS: No acute abnormality. SOFT TISSUES AND SKULL: Small right frontolateral scalp hematoma. No evidence of depressed skull fractures. CT CERVICAL SPINE  BONES AND ALIGNMENT: Cervical spine demonstrates osteopenia without evidence of fractures or focal pathologic bone lesion. There is bone-on-bone anterior atlanto-dental joint space loss with osteophytes and degenerative cystic changes in the dens with retroodontoid pannus. Reversed cervical lordosis is again noted with minimal grade 1 degenerative anterolisthesis at the levels C2-C3, C3-C4, and C7-T1. No acute fracture or traumatic malalignment. DEGENERATIVE CHANGES: The cervical discs are chronically collapsed with bidirectional osteophytes at C4-C5, C5-C6, and C6-C7. At C4-C5 and C5-C6, this is associated with spinal canal stenosis and mild to moderate spondylotic cord compression due to posterior osteophytes, unchanged . Other levels do not show significant cord encroachment. The discs are otherwise normal in height. Moderate facet joint and uncinate hypertrophy  from C2-C3 through C5-C6, with bilateral acquired foraminal stenosis most severe at C3-C4, C4-C5 and C5-C6. SOFT TISSUES: No prevertebral soft tissue swelling. No spinal canal hematoma. There is a stable 1.4 cm heterogeneous nodule in the right lobe of the thyroid gland. There is no laryngeal mass. . There are heavy calcifications in the proximal cervical ICAs (internal carotid arteries). There are no acute upper thoracic findings. IMPRESSION: 1. Acute on subacute left parieto-occipital subdural hematoma up to 1.4 cm thickness with local adjacent crowding of sulci, but no midline shift or downward mass effect. 2. Moderately advanced cerebral atrophy and small vessel disease. Chronic arachnoid cyst left posterior fossa. 3. Small right frontolateral scalp hematoma without depressed skull fracture. 4. No acute fracture or traumatic malalignment of the cervical spine. 5. Degenerative cervical spondylosis with spinal canal stenosis and mild to moderate spondylotic cord compression at C4-5 and C5-6. 6. osteopenia and multilevel acquired foraminal stenosis. 7. Heavy Vascular calcifications. 8. Results discussed over the telephone with Dr. Floy at 8:27 pm, 01/14/24 with verbal acknowledgment of findings. Electronically signed by: Francis Quam MD 01/14/2024 08:32 PM EST RP Workstation: [id number]           Laneta Blunt, DO [facility] 01/16/2024, 5:05 PM    Dictation software may have been used to generate the above note. Typos may occur and escape review in typed/dictated notes. Please contact Dr Blunt directly for clarity if needed.  Staff may message me via secure chat in Epic  but this may not receive an immediate response,  please page me for urgent matters!  If 7PM-7AM, please contact night coverage [email or web address]       "

## 2024-01-16 NOTE — Evaluation (Signed)
 Occupational Therapy Evaluation Patient Details Name: Francisco Knapp MRN: [id number] DOB: 1923-12-08 Today's Date: 01/16/2024   History of Present Illness   Pt is a 89 y.o. male admitted with acute on chronic SDH with trace midline shift towards R in the setting of multiple falls at home with skin tear on R elbow. R right lateral fifth through seventh rib fx. Per neurosurgery, not a surgical candidate at this time. PMH significant for DM type II, CABG.     Clinical Impressions Pt admitted with above. Pt lives alone, uses 4WW for mobility with frequent falls hx, and is generally mod ind for ADL performance. Son lives four hours away but provides intermittent support for meals, transportation. Pt HOH, A&Ox4, and experiences R lateral rib pain with guarded movements throughout. Decreased RUE ROM when compared to LUE; attribute associated R rib pain impacting full ROM, pt has equal grip strength bilaterally. MIN A to rise from elevated bed, pt requires multiple attempts and increased time to shift weight, able to take pivotal steps towards recliner using RW and MIN A overall. Anticipate MIN A for UB ADLs, MAX A for LB ADLs, and MIN A using RW for BSC t/f. Pt would benefit from skilled OT services to address noted impairments and functional limitations (see below for any additional details) in order to maximize safety and independence while minimizing falls risk and caregiver burden. Anticipate the need for follow up OT services upon acute hospital DC. Patient will benefit from continued inpatient follow up therapy, <3 hours/day      If plan is discharge home, recommend the following:   A lot of help with walking and/or transfers;A lot of help with bathing/dressing/bathroom;Assist for transportation;Help with stairs or ramp for entrance     Functional Status Assessment   Patient has had a recent decline in their functional status and demonstrates the ability to make significant improvements in  function in a reasonable and predictable amount of time.     Equipment Recommendations   Other (comment);BSC/3in1 (& RW)      Precautions/Restrictions   Precautions Precautions: Fall Restrictions Weight Bearing Restrictions Per Provider Order: No     Mobility Bed Mobility Overal bed mobility: Needs Assistance             General bed mobility comments: NT, pt recieved sitting upright at EOB upon arrival    Transfers Overall transfer level: Needs assistance Equipment used: Rolling walker (2 wheels) Transfers: Sit to/from Stand, Bed to chair/wheelchair/BSC Sit to Stand: Min assist     Step pivot transfers: Min assist     General transfer comment: requires cues for hand placement, increased time and several attempts before pt able to power up to stand. relies heavily on BUE support, is able to take pivotal steps to recliner with MIN A overall      Balance Overall balance assessment: Needs assistance Sitting-balance support: No upper extremity supported, Feet supported Sitting balance-Leahy Scale: Good Sitting balance - Comments: maintains static sitting with sup/mod indep; limited movement outside immediate BOS due to pain   Standing balance support: Bilateral upper extremity supported, During functional activity, Reliant on assistive device for balance Standing balance-Leahy Scale: Fair                             ADL either performed or assessed with clinical judgement   ADL Overall ADL's : Needs assistance/impaired  Toilet Transfer: Minimal assistance;Rolling walker (2 wheels);BSC/3in1;Cueing for sequencing;Cueing for safety Toilet Transfer Details (indicate cue type and reason): simulated bed > recliner. requires cues for hand placement and increased time with multiple attempts to stand with appropriate guarding at R ribs Toileting- Clothing Manipulation and Hygiene: Maximal assistance;Sit to/from stand        Functional mobility during ADLs: Minimal assistance;Rolling walker (2 wheels);Cueing for sequencing;Cueing for safety General ADL Comments: anticipate MIN A for UB ADLs, MAX A for LB ADLs and MIN A using RW for BSC t/f     Vision Baseline Vision/History: 1 Wears glasses Ability to See in Adequate Light: 0 Adequate Patient Visual Report: No change from baseline              Pertinent Vitals/Pain Pain Assessment Pain Assessment: Faces Faces Pain Scale: Hurts even more Pain Location: back, R ribs/flank Pain Descriptors / Indicators: Aching, Discomfort, Grimacing Pain Intervention(s): Limited activity within patient's tolerance, Monitored during session, Repositioned, Ice applied     Extremity/Trunk Assessment Upper Extremity Assessment Upper Extremity Assessment: Right hand dominant;Generalized weakness (decreased RUE ROM compared to LUE; pt with guarding and grimacing at R rib fx site. grip strength equal bilatearlly)   Lower Extremity Assessment Lower Extremity Assessment: Defer to PT evaluation;Generalized weakness   Cervical / Trunk Assessment Cervical / Trunk Assessment: Kyphotic   Communication Communication Communication: Impaired Factors Affecting Communication: Hearing impaired   Cognition Arousal: Alert Behavior During Therapy: WFL for tasks assessed/performed Cognition: No apparent impairments             OT - Cognition Comments: A&Ox4, extremely HOH                 Following commands: Intact       Cueing  General Comments   Cueing Techniques: Verbal cues  alerted RN to bandage at R elbow with weeping           Home Living Family/patient expects to be discharged to:: Private residence Living Arrangements: Alone Available Help at Discharge: Family;Available PRN/intermittently Type of Home: House Home Access: Stairs to enter [employer] of Steps: 3 Entrance Stairs-Rails: None Home Layout: Multi-level;Laundry or work area in  basement;Able to live on main level with bedroom/bathroom Alternate Teacher, Music of Steps: wood working shop in lower level of home; no essential needs in lower level space   Bathroom Shower/Tub: Chief Strategy Officer: Standard     Home Equipment: Rollator (4 wheels)      Lives With: Alone    Prior Functioning/Environment Prior Level of Function : Independent/Modified Independent             Mobility Comments: 4WW for household mobility, 2-3+ falls ADLs Comments: mod ind for ADLs, son assists for meals and transportation. Son lives 4 hours away and is looking into LTC options    OT Problem List: Decreased strength;Decreased range of motion;Decreased activity tolerance;Impaired balance (sitting and/or standing);Decreased knowledge of use of DME or AE;Decreased knowledge of precautions;Cardiopulmonary status limiting activity;Impaired UE functional use;Pain   OT Treatment/Interventions: Self-care/ADL training;Therapeutic exercise;DME and/or AE instruction;Therapeutic activities;Patient/family education;Balance training      OT Goals(Current goals can be found in the care plan section)   Acute Rehab OT Goals OT Goal Formulation: With patient/family Time For Goal Achievement: 01/30/24 Potential to Achieve Goals: Good   OT Frequency:  Min 2X/week       AM-PAC OT 6 Clicks Daily Activity     Outcome Measure Help from another person eating meals?: None  Help from another person taking care of personal grooming?: None Help from another person toileting, which includes using toliet, bedpan, or urinal?: A Lot Help from another person bathing (including washing, rinsing, drying)?: A Lot Help from another person to put on and taking off regular upper body clothing?: A Little Help from another person to put on and taking off regular lower body clothing?: A Lot 6 Click Score: 17   End of Session Equipment Utilized During Treatment: Rolling walker (2  wheels) Nurse Communication: Mobility status;Precautions (R elbow lac dressing with drainage)  Activity Tolerance: Patient tolerated treatment well Patient left: in chair;with call bell/phone within reach;with chair alarm set;with family/visitor present  OT Visit Diagnosis: Repeated falls (R29.6);Muscle weakness (generalized) (M62.81);Pain;History of falling (Z91.81);Unsteadiness on feet (R26.81) Pain - Right/Left: Right Pain - part of body:  (ribs)                Time: 8845-8786 OT Time Calculation (min): 19 min Charges:  OT General Charges $OT Visit: 1 Visit OT Evaluation $OT Eval Moderate Complexity: 1 Mod  Arafat Cocuzza L. Gardenia Witter, OTR/L  01/16/2024, 12:47 PM

## 2024-01-17 ENCOUNTER — Telehealth: Payer: Self-pay | Admitting: Neurosurgery

## 2024-01-17 ENCOUNTER — Other Ambulatory Visit: Payer: Self-pay | Admitting: Family Medicine

## 2024-01-17 DIAGNOSIS — I6203 Nontraumatic chronic subdural hemorrhage: Secondary | ICD-10-CM | POA: Diagnosis not present

## 2024-01-17 DIAGNOSIS — I6201 Nontraumatic acute subdural hemorrhage: Secondary | ICD-10-CM | POA: Diagnosis not present

## 2024-01-17 LAB — GLUCOSE, CAPILLARY
Glucose-Capillary: 116 mg/dL — ABNORMAL HIGH (ref 70–99)
Glucose-Capillary: 131 mg/dL — ABNORMAL HIGH (ref 70–99)
Glucose-Capillary: 281 mg/dL — ABNORMAL HIGH (ref 70–99)
Glucose-Capillary: 93 mg/dL (ref 70–99)

## 2024-01-17 MED ORDER — METFORMIN HCL 500 MG PO TABS
500.0000 mg | ORAL_TABLET | Freq: Two times a day (BID) | ORAL | Status: DC
  Administered 2024-01-17 – 2024-01-20 (×7): 500 mg via ORAL
  Filled 2024-01-17 (×7): qty 1

## 2024-01-17 NOTE — TOC Initial Note (Incomplete)
 Transition of Care [facility]) - Initial/Assessment Note    Patient Details  Name: Francisco Knapp MRN: [id number] Date of Birth: 04-04-1923  [facility] CM/SW Contact:    Francisco CHRISTELLA Ring, RN Phone Number: 01/17/2024, 9:37 AM  Clinical Narrative:                 CM met with patient at the bedside, he is alert, pleasant able to answer CM's questions.  Introduced self and explained role in DC planning, he is very HOH.  He says that his son has been talking to the Dr about discharge and that they are going to send him somewhere.  Per patient okay to call son who is currently in [place] and will be here later this afternoon.   CM explained to son that patient has no met the 3 night Medicare SNF rule- he did come in on 12/26 but was not admitted until 12/27 and then the DC order went in yesterday so really he has only crossed one midnight as an inpatient.  Son is very upset by this news as he cannot take his dad home and care for him as he needs and he reports that they cannot private pay.   CM reached out to the Dr.  Patient does have [facility] [employer], reaching out to the [place] to see if they can cover a STR stay.    Expected Discharge Plan: Skilled Nursing Facility Barriers to Discharge: SNF Pending bed offer   Patient Goals and CMS Choice Patient states their goals for this hospitalization and ongoing recovery are:: Patient says that he will be going to a rehab place, son also wants him to be able to go to short term rehab CMS Medicare.gov Compare Post Acute Care list provided to:: Patient Represenative (must comment) Choice offered to / list presented to : Adult Children      Expected Discharge Plan and Services   Discharge Planning Services: CM Consult Post Acute Care Choice: Skilled Nursing Facility Living arrangements for the past 2 months: Single Family Home Expected Discharge Date: 01/16/24               DME Arranged: N/A                    Prior  Living Arrangements/Services Living arrangements for the past 2 months: Single Family Home Lives with:: Self Patient language and need for interpreter reviewed:: Yes Do you feel safe going back to the place where you live?: No   Cant take care of himself at this time after fall at home  Need for Family Participation in Patient Care: Yes (Comment) Care giver support system in place?: Yes (comment) Current home services: DME (rollator) Criminal Activity/Legal Involvement Pertinent to Current Situation/Hospitalization: No - Comment as needed  Activities of Daily Living   ADL Screening (condition at time of admission) Independently performs ADLs?: Yes (appropriate for developmental age) (Son helps out) Is the patient deaf or have difficulty hearing?: Yes Does the patient have difficulty seeing, even when wearing glasses/contacts?: No Does the patient have difficulty concentrating, remembering, or making decisions?: No  Permission Sought/Granted Permission sought to share information with : Family Supports, Magazine Features Editor, Case Estate Manager/land Agent granted to share information with : Yes, Verbal Permission Granted  Share Information with NAME: Francisco Knapp  Permission granted to share info w AGENCY: SNF  Permission granted to share info w Relationship: son  Permission granted to share info w Contact Information:  [phone]  Emotional Assessment Appearance:: Appears stated age Attitude/Demeanor/Rapport: Engaged Affect (typically observed): Accepting Orientation: : Oriented to Self, Oriented to Place, Oriented to  Time, Oriented to Situation Alcohol / Substance Use: Not Applicable Psych Involvement: No (comment)  Admission diagnosis:  SDH (subdural hematoma) (HCC) [S06.5XAA] Fall, initial encounter [id number]] Skin tear of right elbow without complication, initial encounter [S51.011A] Acute on chronic intracranial subdural hematoma (HCC) [I62.01, I62.03] Patient Active  Problem List   Diagnosis Date Noted   Acute on chronic intracranial subdural hematoma (HCC) 01/15/2024   Essential hypertension 01/15/2024   Type 2 diabetes mellitus without complications (HCC) 01/15/2024   Dyslipidemia 01/15/2024   SDH (subdural hematoma) (HCC) 01/15/2024   TIA (transient ischemic attack) 02/05/2022   Hypertension 02/05/2022   Diabetes mellitus without complication (HCC) 02/05/2022   Hypercholesteremia 02/05/2022   Sensorineural hearing loss, bilateral 02/05/2022   CAD s/p CABG 2011 02/05/2022   CAD (coronary artery disease), autologous vein bypass graft 05/24/2013   PCP:  Francisco Alm SQUIBB, MD Pharmacy:   [facility] - [place], [place] - [address] [place] [address] Phone: [phone] Fax: [phone]     Social Drivers of Health (SDOH) Social History: SDOH Screenings   Food Insecurity: No Food Insecurity (01/15/2024)  Housing: Low Risk (01/15/2024)  Transportation Needs: No Transportation Needs (01/15/2024)  Utilities: Not At Risk (01/15/2024)  Financial Resource Strain: Low Risk  (09/21/2023)   [facility]  Social Connections: Socially Isolated (01/16/2024)  Tobacco Use: Low Risk (01/14/2024)   SDOH Interventions:     Readmission Risk Interventions     No data to display

## 2024-01-17 NOTE — Progress Notes (Signed)
 " PROGRESS NOTE    Francisco Knapp   FMW:[id number] DOB: [birth year]  DOA: 01/14/2024 Date of Service: 01/17/2024 which is hospital day 2  PCP: Epifanio Alm SQUIBB, MD    Hospital course / significant events:   HPI: Francisco Knapp is a 89 y.o. 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  He states that he has been tripping.  He denied any presyncope or syncope.  Per his son he sits in the chair for a while and has been getting muscle atrophy.  He has been sports coach.  He fell about twice over the last couple weeks with head injury.  He denies any paresthesias or new focal muscle weakness.  No fever or chills.  No nausea or vomiting or abdominal pain.  He has been having back spasms.  No chest pain or palpitations.  No cough or wheezing or dyspnea.   12/26: to ED. BP was 167/86 with otherwise normal vital signs.  Labs revealed blood glucose of 180 and creatinine 1.26, high-sensitivity troponin T of 57 CBC showed leukocytosis 13.2 with neutrophilia, hemoglobin 11.3 hematocrit 34.9 close to previous levels.  UA with remarkable for 50 glucose and otherwise negative. EKG controlled afib. CT head 19:51 --> (+)acute on subacute L parieto-occipital SDH up to 1/4 cm, no midline shift or mass effect, (+)advanced cerebral atrophy, (+)small R frontotemporal scalp hematoma.  12/27: Repeat CT in 6h per neurosurgery --> (+)enlarging SDH noted but measures at 1.4 cm again... --> per neurosurgery nothing to do at this time. Rib XR (+)rib fx. 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 Will be placed on as needed IV labetalol for optimal BP control.    No concerns based on BMI: Body mass index is 23.98 kg/m.SABRA Significantly low or high BMI is associated with higher medical risk.  Underweight - under 18  overweight - 25 to 29 obese - 30 or more Class 1 obesity: BMI of 30.0 to 34 Class 2 obesity: BMI of 35.0 to 39 Class 3 obesity: BMI of 40.0 to 49 Super Morbid Obesity: BMI 50-59 Super-super Morbid Obesity: BMI 60+ Healthy nutrition and physical activity advised as adjunct to other disease management and risk reduction treatments    DVT prophylaxis: holding w/ SDH IV fluids: no continuous IV fluids  Nutrition: carb modofied diet  Central lines / other devices: none  Code Status: DNR ACP documentation reviewed:  none on file in [facility]  [facility] needs: SNF rehab Medical barriers to dispo: none.              Subjective / Brief ROS:  Patient reports  feeing okay today other than occasional severe spasm type pain where his ribs are fractured.  He denies hip or knee pain, denies shoulder pain. Denies CP/SOB.  Pain controlled.  Denies new weakness.  Tolerating diet.  Reports no concerns w/ urination/defecation.   Family Communication: Spoke at length with son at bedside and with patient's family friend/golf partner Dr. Franchot who used to practice/was involved in administration at [facility].  There was some confusion regarding  disposition planning, which I will address with the appropriate parties.  By end of our discussion, family seems satisfied with the plan and all questions were answered to best of my ability.   Objective Findings:  Vitals:   01/16/24 2015 01/17/24 0432 01/17/24 0748 01/17/24 1618  BP: (!) 159/74 136/71 (!) 140/61 139/75  Pulse: 75 76 87 79  Resp: 16  16 16   Temp: 98.9 F (37.2 C) 98.3 F (36.8 C) 98.1 F (36.7 C) 98.2 F (36.8 C)  TempSrc: Oral Oral Oral   SpO2: 97% 96% 99% 97%  Weight:      Height:        Intake/Output Summary (Last 24 hours) at 01/17/2024 1817 Last data filed at 01/17/2024 1048 Gross per 24 hour  Intake 480 ml  Output 1200 ml  Net -720 ml   Filed Weights   01/14/24 1810 01/15/24 0825  Weight: 78 kg 78.2 kg    Examination:  Physical Exam Constitutional:      General: He is not in acute distress. Cardiovascular:     Rate and Rhythm: Normal rate and regular rhythm.  Pulmonary:     Effort: Pulmonary effort is normal.     Breath sounds: Normal breath sounds.  Skin:    Comments: See photo  Neurological:     Mental Status: He is alert and oriented to person, place, and time. Mental status is at baseline.  Psychiatric:        Mood and Affect: Mood normal.        Behavior: Behavior normal.          Scheduled Medications:   Chlorhexidine  Gluconate Cloth  6 each Topical Daily   feeding supplement  237 mL Oral BID BM   metFORMIN   500 mg Oral BID WC   metoprolol  succinate  25 mg Oral Daily   pantoprazole   40 mg Oral QHS   ramipril   5 mg Oral Daily   senna-docusate  1 tablet Oral BID   simvastatin   40 mg Oral QHS    Continuous Infusions:   PRN Medications:  acetaminophen , hydrALAZINE , lidocaine , magnesium  hydroxide, methocarbamol , ondansetron  **OR** ondansetron  (ZOFRAN ) IV, mouth rinse, traZODone   Antimicrobials from admission:  Anti-infectives (From admission, onward)    None           Data Reviewed:  I have personally  reviewed the following...  CBC: Recent Labs  Lab 01/14/24 2058 01/15/24 0600  WBC 13.3* 10.6*  NEUTROABS 9.8*  --   HGB 11.3* 11.1*  HCT 34.9* 33.9*  MCV 92.1 91.1  PLT 199 194   Basic Metabolic Panel: Recent Labs  Lab 01/14/24 2058 01/15/24 0600  NA 140 138  K 4.6 4.4  CL 104 105  CO2 23 24  GLUCOSE 180* 183*  BUN 23 21  CREATININE 1.26* 1.23  CALCIUM 9.0 9.0   GFR: Estimated Creatinine Clearance: 35.7 mL/min (by C-G formula based on SCr of 1.23 mg/dL). Liver Function Tests: No results for input(s): AST, ALT, ALKPHOS, BILITOT, PROT, ALBUMIN in the last 168 hours.  No results for input(s): LIPASE, AMYLASE in the last 168 hours. No results for input(s): AMMONIA in the last 168 hours. Coagulation Profile: No results for input(s): INR, PROTIME in the last 168 hours. Cardiac Enzymes: No results for input(s): CKTOTAL, CKMB, CKMBINDEX, TROPONINI in the last 168 hours. BNP (last 3 results) No results for input(s): PROBNP in the last 8760 hours. HbA1C: Recent Labs    01/15/24 0600  HGBA1C 6.9*   CBG: Recent Labs  Lab 01/16/24 2017 01/17/24 0058 01/17/24 0434 01/17/24 0739 01/17/24 1144  GLUCAP 84 93 131* 116* 281*   Lipid Profile: No results for input(s): CHOL, HDL, LDLCALC, TRIG, CHOLHDL, LDLDIRECT in the last 72 hours. Thyroid Function Tests: No results for input(s): TSH, T4TOTAL, FREET4, T3FREE, THYROIDAB in the last 72 hours. Anemia Panel: No results for input(s): VITAMINB12, FOLATE, FERRITIN, TIBC, IRON, RETICCTPCT in the last 72 hours. Most Recent Urinalysis On File:     Component Value Date/Time   COLORURINE YELLOW (A) 01/14/2024 2341   APPEARANCEUR CLEAR (A) 01/14/2024 2341   LABSPEC 1.015 01/14/2024 2341   PHURINE 6.0 01/14/2024 2341   GLUCOSEU 50 (A) 01/14/2024 2341   HGBUR SMALL (A) 01/14/2024 2341   BILIRUBINUR NEGATIVE 01/14/2024 2341   KETONESUR NEGATIVE 01/14/2024 2341    PROTEINUR NEGATIVE 01/14/2024 2341   NITRITE NEGATIVE 01/14/2024 2341   LEUKOCYTESUR NEGATIVE 01/14/2024 2341   Sepsis Labs: @LABRCNTIP (procalcitonin:4,lacticidven:4) Microbiology: Recent Results (from the past 240 hours)  MRSA Next Gen by PCR, Nasal     Status: None   Collection Time: 01/15/24  8:32 AM   Specimen: Nasal Mucosa; Nasal Swab  Result Value Ref Range Status   MRSA by PCR Next Gen NOT DETECTED NOT DETECTED Final    Comment: (NOTE) The GeneXpert MRSA Assay (FDA approved for NASAL specimens only), is one component of a comprehensive MRSA colonization surveillance program. It is not intended to diagnose MRSA infection nor to guide or monitor treatment for MRSA infections. Test performance is not FDA approved in patients less than 49 years old. Performed at [facility], [address], [place], [place] [address]       Radiology Studies last 3 days: DG Ribs Unilateral Right Result Date: 01/15/2024 CLINICAL DATA:  Rib pain after fall. EXAM: RIGHT RIBS - 2 VIEW COMPARISON:  None Available. FINDINGS: Minimally displaced fractures of right lateral fifth through seventh ribs. No pneumothorax or large pleural effusion. The right lung is clear. IMPRESSION: Minimally displaced fractures of right lateral fifth through seventh ribs. No pneumothorax. Electronically Signed   By: Andrea Gasman M.D.   On: 01/15/2024 17:07   CT HEAD WO CONTRAST ( ) Result Date: 01/15/2024 EXAM: CT HEAD WITHOUT CONTRAST 01/15/2024 08:15:00 AM TECHNIQUE: CT of the head was performed without the administration of intravenous contrast. Automated exposure control, iterative reconstruction, and/or weight based adjustment of the mA/kV was utilized to reduce the radiation dose to as low as reasonably achievable. COMPARISON: Head CT 01/15/2024 01:57:00 AM and earlier. CLINICAL HISTORY: 89 year old male. Follow subdural hematoma - serial exam. Status post fall. FINDINGS: BRAIN AND VENTRICLES: Left  hemisphere mixed density subdural hematoma measures up to 7 mm in thickness, stable. Small contralateral isodense right side subdural hematoma measures 2 to 3 mm in thickness, mildly redistributed but otherwise stable since initial presentation. Small volume of hyperdense blood redistributed along the tentorium is incidentally noted. Trace rightward midline shift is stable (coronal image 20). Stable mild mass effect on the left lateral ventricle with no ventriculomegaly. Basilar cisterns remain patent. Calcified  atherosclerosis at the skull base. No suspicious intracranial vascular hyperdensity. Stable gray white differentiation. No evidence of acute infarct. No hydrocephalus. ORBITS: No acute abnormality. SINUSES: No acute abnormality. SOFT TISSUES AND SKULL: Left anterior convexity broad based scalp hematoma appears stable on series 3 image 48. No skull fracture. IMPRESSION: 1. Stable left subdural hematoma (7 mm). Stable smaller right subdural hematoma (2-3 mm). Stable trace rightward midline shift. 2. No new intracranial abnormality. Electronically signed by: Helayne Hurst MD 01/15/2024 08:30 AM EST RP Workstation: [id number]   CT Head Wo Contrast Result Date: 01/15/2024 EXAM: CT HEAD WITHOUT CONTRAST 01/15/2024 02:05:47 AM TECHNIQUE: CT of the head was performed without the administration of intravenous contrast. Automated exposure control, iterative reconstruction, and/or weight based adjustment of the mA/kV was utilized to reduce the radiation dose to as low as reasonably achievable. COMPARISON: 01/14/2024 CLINICAL HISTORY: fall, SDH evaluation FINDINGS: BRAIN AND VENTRICLES: Acute subdural hematoma along left cerebral convexity measuring up to 1.4 cm, increased from prior 1.1 cm, with hyperdense blood now overlying the entire left convexity. Associated increasing mass effect with effacement of the left lateral ventricle, sulcal crowding and effacement of left parietal and occipital sulci. No midline shift.  New small extension of hemorrhage along left tentorium. Left posterior fossa arachnoid cyst. Mild cerebral atrophy. Patchy periventricular white matter hypodensities consistent with chronic microvascular ischemic disease. No evidence of acute infarct. No hydrocephalus. No other extra-axial collection. ORBITS: Bilateral lens replacement noted. SINUSES: No acute abnormality. SOFT TISSUES AND SKULL: Right frontal scalp contusion. No skull fracture. VASCULATURE: Moderate atherosclerotic calcification of carotid siphons and vertebral arteries. IMPRESSION: 1. Acute , enlarging left cerebral convexity subdural hematoma, increased to 1.4 cm, with increasing mass effect and new small extension along the left tentorium, without midline shift. 2. Right frontal scalp contusion. 3. Mild cerebral atrophy, chronic microvascular ischemic disease, left posterior fossa arachnoid cyst, moderate intracranial atherosclerotic calcification, and bilateral lens replacements. 4. These results will be called to the ordering clinician or representative by the radiologist assistant, and communication documented in the pacs or clario dashboard. Electronically signed by: Dorethia Molt MD 01/15/2024 02:15 AM EST RP Workstation: [id number]   CT Head Wo Contrast Result Date: 01/14/2024 EXAM: CT HEAD AND CERVICAL SPINE 01/14/2024 07:51:34 PM TECHNIQUE: CT of the head and cervical spine was performed without the administration of intravenous contrast. Multiplanar reformatted images are provided for review. Automated exposure control, iterative reconstruction, and/or weight based adjustment of the mA/kV was utilized to reduce the radiation dose to as low as reasonably achievable. COMPARISON: Head CT and MRI brain, both dated 02/05/2022. CTA head and neck and reconstructions also from 1/18/. 24. CLINICAL HISTORY: Fall. FINDINGS: CT HEAD BRAIN AND VENTRICLES: There is an acute on subacute left hemispheric subdural hematoma. Hyperdense blood products  measuring up to 1.4 cm in thickness on coronal reconstruction series 4, image 55 are noted along the posterolateral left parieto-occipital convexity merging with isodense blood products measuring 4 mm in thickness in the upper left insular area as measured on image 20 of axial series 2. There is local adjacent crowding of sulci, but no midline shift or downward mass effect due to preexisting atrophy. No further intracranial hemorrhage is seen. . Again noted is moderately advanced cerebral atrophy, small vessel disease, and atrophic ventriculomegaly with relatively mild cerebellar atrophy. There is a 2.2 cm arachnoid cyst again seen laterally in the left posterior fossa without positive mass effect. There are small chronic lacunar infarcts in the left frontal deep white matter. No other old  infarct is evident. No cortical-based acute infarct is seen. The carotid siphons and both distal vertebral arteries are heavily calcified with patchy basilar artery calcific plaques. No hyperdense vessel is seen. ORBITS: Old lens extractions with otherwise negative orbits. SINUSES AND MASTOIDS: No acute abnormality. SOFT TISSUES AND SKULL: Small right frontolateral scalp hematoma. No evidence of depressed skull fractures. CT CERVICAL SPINE BONES AND ALIGNMENT: Cervical spine demonstrates osteopenia without evidence of fractures or focal pathologic bone lesion. There is bone-on-bone anterior atlanto-dental joint space loss with osteophytes and degenerative cystic changes in the dens with retroodontoid pannus. Reversed cervical lordosis is again noted with minimal grade 1 degenerative anterolisthesis at the levels C2-C3, C3-C4, and C7-T1. No acute fracture or traumatic malalignment. DEGENERATIVE CHANGES: The cervical discs are chronically collapsed with bidirectional osteophytes at C4-C5, C5-C6, and C6-C7. At C4-C5 and C5-C6, this is associated with spinal canal stenosis and mild to moderate spondylotic cord compression due to  posterior osteophytes, unchanged . Other levels do not show significant cord encroachment. The discs are otherwise normal in height. Moderate facet joint and uncinate hypertrophy from C2-C3 through C5-C6, with bilateral acquired foraminal stenosis most severe at C3-C4, C4-C5 and C5-C6. SOFT TISSUES: No prevertebral soft tissue swelling. No spinal canal hematoma. There is a stable 1.4 cm heterogeneous nodule in the right lobe of the thyroid gland. There is no laryngeal mass. . There are heavy calcifications in the proximal cervical ICAs (internal carotid arteries). There are no acute upper thoracic findings. IMPRESSION: 1. Acute on subacute left parieto-occipital subdural hematoma up to 1.4 cm thickness with local adjacent crowding of sulci, but no midline shift or downward mass effect. 2. Moderately advanced cerebral atrophy and small vessel disease. Chronic arachnoid cyst left posterior fossa. 3. Small right frontolateral scalp hematoma without depressed skull fracture. 4. No acute fracture or traumatic malalignment of the cervical spine. 5. Degenerative cervical spondylosis with spinal canal stenosis and mild to moderate spondylotic cord compression at C4-5 and C5-6. 6. osteopenia and multilevel acquired foraminal stenosis. 7. Heavy Vascular calcifications. 8. Results discussed over the telephone with Dr. Floy at 8:27 pm, 01/14/24 with verbal acknowledgment of findings. Electronically signed by: Francis Quam MD 01/14/2024 08:32 PM EST RP Workstation: [id number]   CT Cervical Spine Wo Contrast Result Date: 01/14/2024 EXAM: CT HEAD AND CERVICAL SPINE 01/14/2024 07:51:34 PM TECHNIQUE: CT of the head and cervical spine was performed without the administration of intravenous contrast. Multiplanar reformatted images are provided for review. Automated exposure control, iterative reconstruction, and/or weight based adjustment of the mA/kV was utilized to reduce the radiation dose to as low as reasonably  achievable. COMPARISON: Head CT and MRI brain, both dated 02/05/2022. CTA head and neck and reconstructions also from 1/18/. 24. CLINICAL HISTORY: Fall. FINDINGS: CT HEAD BRAIN AND VENTRICLES: There is an acute on subacute left hemispheric subdural hematoma. Hyperdense blood products measuring up to 1.4 cm in thickness on coronal reconstruction series 4, image 55 are noted along the posterolateral left parieto-occipital convexity merging with isodense blood products measuring 4 mm in thickness in the upper left insular area as measured on image 20 of axial series 2. There is local adjacent crowding of sulci, but no midline shift or downward mass effect due to preexisting atrophy. No further intracranial hemorrhage is seen. . Again noted is moderately advanced cerebral atrophy, small vessel disease, and atrophic ventriculomegaly with relatively mild cerebellar atrophy. There is a 2.2 cm arachnoid cyst again seen laterally in the left posterior fossa without positive mass effect. There are  small chronic lacunar infarcts in the left frontal deep white matter. No other old infarct is evident. No cortical-based acute infarct is seen. The carotid siphons and both distal vertebral arteries are heavily calcified with patchy basilar artery calcific plaques. No hyperdense vessel is seen. ORBITS: Old lens extractions with otherwise negative orbits. SINUSES AND MASTOIDS: No acute abnormality. SOFT TISSUES AND SKULL: Small right frontolateral scalp hematoma. No evidence of depressed skull fractures. CT CERVICAL SPINE BONES AND ALIGNMENT: Cervical spine demonstrates osteopenia without evidence of fractures or focal pathologic bone lesion. There is bone-on-bone anterior atlanto-dental joint space loss with osteophytes and degenerative cystic changes in the dens with retroodontoid pannus. Reversed cervical lordosis is again noted with minimal grade 1 degenerative anterolisthesis at the levels C2-C3, C3-C4, and C7-T1. No acute  fracture or traumatic malalignment. DEGENERATIVE CHANGES: The cervical discs are chronically collapsed with bidirectional osteophytes at C4-C5, C5-C6, and C6-C7. At C4-C5 and C5-C6, this is associated with spinal canal stenosis and mild to moderate spondylotic cord compression due to posterior osteophytes, unchanged . Other levels do not show significant cord encroachment. The discs are otherwise normal in height. Moderate facet joint and uncinate hypertrophy from C2-C3 through C5-C6, with bilateral acquired foraminal stenosis most severe at C3-C4, C4-C5 and C5-C6. SOFT TISSUES: No prevertebral soft tissue swelling. No spinal canal hematoma. There is a stable 1.4 cm heterogeneous nodule in the right lobe of the thyroid gland. There is no laryngeal mass. . There are heavy calcifications in the proximal cervical ICAs (internal carotid arteries). There are no acute upper thoracic findings. IMPRESSION: 1. Acute on subacute left parieto-occipital subdural hematoma up to 1.4 cm thickness with local adjacent crowding of sulci, but no midline shift or downward mass effect. 2. Moderately advanced cerebral atrophy and small vessel disease. Chronic arachnoid cyst left posterior fossa. 3. Small right frontolateral scalp hematoma without depressed skull fracture. 4. No acute fracture or traumatic malalignment of the cervical spine. 5. Degenerative cervical spondylosis with spinal canal stenosis and mild to moderate spondylotic cord compression at C4-5 and C5-6. 6. osteopenia and multilevel acquired foraminal stenosis. 7. Heavy Vascular calcifications. 8. Results discussed over the telephone with Dr. Floy at 8:27 pm, 01/14/24 with verbal acknowledgment of findings. Electronically signed by: Francis Quam MD 01/14/2024 08:32 PM EST RP Workstation: [id number]           Laneta Blunt, DO [facility] 01/17/2024, 6:17 PM    Dictation software may have been used to generate the above note. Typos may occur  and escape review in typed/dictated notes. Please contact Dr Blunt directly for clarity if needed.  Staff may message me via secure chat in Epic  but this may not receive an immediate response,  please page me for urgent matters!  If 7PM-7AM, please contact night coverage [email or web address]       "

## 2024-01-17 NOTE — Progress Notes (Signed)
 Occupational Therapy Treatment Patient Details Name: Francisco Knapp MRN: [id number] DOB: November 09, 1923 Today's Date: 01/17/2024   History of present illness Pt is a 89 y.o. male admitted with acute on chronic SDH with trace midline shift towards R in the setting of multiple falls at home with skin tear on R elbow. R right lateral fifth through seventh rib fx. Per neurosurgery, not a surgical candidate at this time. PMH significant for DM type II, CABG.   OT comments  Pt is supine in bed on arrival with sone present. Pleasant and agreeable to OT/PT co-tx session to manage pain while promoting mobility. He has spasms causing grimacing and yelping at times during session and more so when attempting to reaching out of base of support to grab items from bedside table. MIN/CGA for bed mobility with increased time and cues for technique to minimize pain. Min A to stand and pivot from bed to recliner using RW with cues for pacing, proper RW management and safety cues, e.g. reaching back for recliner. +2 utilized for safety d/t significant pain earlier in the day. He performed seated oral care in recliner with set up assist. Continues to limited his RUE ROM d/t causing increased pain from R rib fxs. He is left seated with son present upon exit with all needs in place and will cont to require skilled acute OT services to maximize his safety and IND to return to PLOF.       If plan is discharge home, recommend the following:  A lot of help with bathing/dressing/bathroom;Assist for transportation;Help with stairs or ramp for entrance;A little help with walking and/or transfers   Equipment Recommendations  Other (comment);BSC/3in1 (RW; defer to next venue)    Recommendations for Other Services      Precautions / Restrictions Precautions Precautions: Fall Recall of Precautions/Restrictions: Intact Precaution/Restrictions Comments: R 5-7 rib fxs (Right 5, 6, 7,rib fractures) Required Braces or Orthoses:  Other Brace (4inch ace wrap rib binder) Other Brace: ace wrap rib binder Restrictions Weight Bearing Restrictions Per Provider Order: No       Mobility Bed Mobility Overal bed mobility: Needs Assistance Bed Mobility: Supine to Sit     Supine to sit: HOB elevated, Used rails, Min assist, Contact guard     General bed mobility comments: increased time and cues, Min/CGA provided for pain management    Transfers Overall transfer level: Needs assistance Equipment used: Rolling walker (2 wheels) Transfers: Sit to/from Stand, Bed to chair/wheelchair/BSC Sit to Stand: Min assist     Step pivot transfers: Min assist     General transfer comment: Min A overall for STS from EOB and SPT to recliner with +2 for safety, although could have done with +1; cues for hand placement and pacing to prevent falls     Balance Overall balance assessment: Needs assistance Sitting-balance support: No upper extremity supported, Feet supported Sitting balance-Leahy Scale: Good Sitting balance - Comments: MOD I/SUPV for static seated balance and minimal reaching OOBOS d/t pain   Standing balance support: Bilateral upper extremity supported, During functional activity, Reliant on assistive device for balance Standing balance-Leahy Scale: Fair Standing balance comment: RW                           ADL either performed or assessed with clinical judgement   ADL Overall ADL's : Needs assistance/impaired     Grooming: Oral care;Set up;Sitting Grooming Details (indicate cue type and reason): using R dominant hand  despite some pain from R sided rib fxs         Upper Body Dressing : Moderate assistance;Sitting Upper Body Dressing Details (indicate cue type and reason): change out soiled gown     Toilet Transfer: Minimal assistance;Rolling walker (2 wheels);Cueing for sequencing;Cueing for safety;Stand-pivot Toilet Transfer Details (indicate cue type and reason): stand step turn to  recliner from EOB with RW and Min A for safety, pacing           General ADL Comments: anticipate Mod/Max A for LB ADL management d/t pain/spasms    Extremity/Trunk Assessment              Vision       Perception     Praxis     Communication Communication Communication: Impaired Factors Affecting Communication: Hearing impaired   Cognition Arousal: Alert Behavior During Therapy: WFL for tasks assessed/performed                                 Following commands: Intact        Cueing   Cueing Techniques: Verbal cues  Exercises      Shoulder Instructions       General Comments  (Right lower back area brusied, Right forearm w/ large skin tear and dressing intact.)    Pertinent Vitals/ Pain       Pain Assessment Pain Assessment: Faces Faces Pain Scale: Hurts even more Pain Location: back/R shoulder blade and flank/ribs; especially during spasms Pain Descriptors / Indicators: Aching, Discomfort, Grimacing, Spasm Pain Intervention(s): Monitored during session, Repositioned, Limited activity within patient's tolerance  Home Living                                          Prior Functioning/Environment              Frequency  Min 2X/week        Progress Toward Goals  OT Goals(current goals can now be found in the care plan section)  Progress towards OT goals: Progressing toward goals  Acute Rehab OT Goals OT Goal Formulation: With patient/family Time For Goal Achievement: 01/30/24 Potential to Achieve Goals: Good  Plan      Co-evaluation    PT/OT/SLP Co-Evaluation/Treatment: Yes Reason for Co-Treatment: For patient/therapist safety;To address functional/ADL transfers PT goals addressed during session: Mobility/safety with mobility OT goals addressed during session: ADL's and self-care      AM-PAC OT 6 Clicks Daily Activity     Outcome Measure   Help from another person eating meals?: A Little Help  from another person taking care of personal grooming?: A Little Help from another person toileting, which includes using toliet, bedpan, or urinal?: A Lot Help from another person bathing (including washing, rinsing, drying)?: A Lot Help from another person to put on and taking off regular upper body clothing?: A Little Help from another person to put on and taking off regular lower body clothing?: A Lot 6 Click Score: 15    End of Session Equipment Utilized During Treatment: Rolling walker (2 wheels)  OT Visit Diagnosis: Repeated falls (R29.6);Muscle weakness (generalized) (M62.81);Pain;History of falling (Z91.81);Unsteadiness on feet (R26.81) Pain - Right/Left: Right   Activity Tolerance Patient tolerated treatment well   Patient Left in chair;with call bell/phone within reach;with chair alarm set;with family/visitor present   Nurse Communication Mobility  status        Time: 8387-8357 OT Time Calculation (min): 30 min  Charges: OT General Charges $OT Visit: 1 Visit OT Treatments $Self Care/Home Management : 8-22 mins  Murel Shenberger Chrismon, OTR/L  01/17/2024, 5:11 PM   Khalil Szczepanik E Chrismon 01/17/2024, 5:08 PM

## 2024-01-17 NOTE — Progress Notes (Signed)
 Physical Therapy Treatment Patient Details Name: Francisco Knapp MRN: [id number] DOB: 06-03-1923 Today's Date: 01/17/2024   History of Present Illness Pt is a 89 y.o. male admitted with acute on chronic SDH with trace midline shift towards R in the setting of multiple falls at home with skin tear on R elbow. R right lateral fifth through seventh rib fx. Per neurosurgery, not a surgical candidate at this time. PMH significant for DM type II, CABG.    PT Comments  Pt received in bed, son at bedside for co-tx with OT in order to safely progress functional mobility sue to pt's pain level and tolerance for activity. Discussed current LOF and deficits at length, pt remains very motivated to return to prior independent LOF. Good demonstration of supine to sit with increased time and MinA to gingerly move to edge of bed. No c/o HA or dizziness with positional changes. MinA of 2 for safety, cues, and assist to stand at [facility] and advance a few steps to bedside chair. Anticipate good progression with gait training and standing activities next session. Initial recs for STR remain appropriate.    If plan is discharge home, recommend the following: A lot of help with walking and/or transfers;A lot of help with bathing/dressing/bathroom   Can travel by private vehicle     No  Equipment Recommendations  Rolling walker (2 wheels);BSC/3in1    Recommendations for Other Services       Precautions / Restrictions Precautions Precautions: Fall Recall of Precautions/Restrictions: Intact Precaution/Restrictions Comments: Right 5, 6, 7 rib fractures Required Braces or Orthoses: Other Brace (4 inch ace wrap for rib compression) Restrictions Weight Bearing Restrictions Per Provider Order: No     Mobility  Bed Mobility Overal bed mobility: Needs Assistance Bed Mobility: Supine to Sit     Supine to sit: Min assist, Contact guard, +2 for physical assistance, +2 for safety/equipment, HOB elevated     General  bed mobility comments:  (Two therapists present to ease pt's ability to transfer to EOB due to limiting rib pain)    Transfers Overall transfer level: Needs assistance Equipment used: Rolling walker (2 wheels) Transfers: Sit to/from Stand, Bed to chair/wheelchair/BSC Sit to Stand: Min assist, +2 physical assistance, +2 safety/equipment   Step pivot transfers: Min assist       General transfer comment:  (Pt able to stand from EOB with +2 for safety, pain control, and pt confidence. Could have stood with single therapist assist.)    Ambulation/Gait               General Gait Details:  (No true gait, however pt able to advance several steps toward bedside chair with support of RW and close CGA for safety.)   Stairs             Wheelchair Mobility     Tilt Bed    Modified Rankin (Stroke Patients Only)       Balance Overall balance assessment: Needs assistance Sitting-balance support: No upper extremity supported, Feet supported Sitting balance-Leahy Scale: Good Sitting balance - Comments: maintains static sitting with sup/mod indep; limited movement outside immediate BOS due to pain   Standing balance support: Bilateral upper extremity supported, During functional activity, Reliant on assistive device for balance Standing balance-Leahy Scale: Fair                              Communication Communication Communication: Impaired Factors Affecting Communication: Hearing impaired  Cognition Arousal: Alert Behavior During Therapy: WFL for tasks assessed/performed   PT - Cognitive impairments: No apparent impairments                         Following commands: Intact      Cueing Cueing Techniques: Verbal cues  Exercises Other Exercises Other Exercises: Pt able to complete B ankle pumps in bed, limited R UE & B LE ROM due to triggering of rib pain. L UE WNL Other Exercises:  (Pt and pt's son educated on role of acute PT with  transition to STR once medically cleared)    General Comments General comments (skin integrity, edema, etc.):  (Right lower back area brusied, Right forearm w/ large skin tear and dressing intact.)      Pertinent Vitals/Pain Pain Assessment Pain Assessment: Faces Faces Pain Scale: Hurts even more Pain Location: back, R ribs/flank Pain Descriptors / Indicators: Aching, Discomfort, Grimacing Pain Intervention(s): Monitored during session, Patient requesting pain meds-RN notified    Home Living                          Prior Function            PT Goals (current goals can now be found in the care plan section) Acute Rehab PT Goals Patient Stated Goal: agreeable to session Progress towards PT goals: Progressing toward goals    Frequency    Min 2X/week      PT Plan      Co-evaluation PT/OT/SLP Co-Evaluation/Treatment: Yes Reason for Co-Treatment: For patient/therapist safety;To address functional/ADL transfers PT goals addressed during session: Mobility/safety with mobility OT goals addressed during session: ADL's and self-care      AM-PAC PT 6 Clicks Mobility   Outcome Measure  Help needed turning from your back to your side while in a flat bed without using bedrails?: A Little Help needed moving from lying on your back to sitting on the side of a flat bed without using bedrails?: A Little Help needed moving to and from a bed to a chair (including a wheelchair)?: A Lot Help needed standing up from a chair using your arms (e.g., wheelchair or bedside chair)?: A Lot Help needed to walk in hospital room?: A Lot Help needed climbing 3-5 steps with a railing? : A Lot 6 Click Score: 14    End of Session Equipment Utilized During Treatment: Gait belt Activity Tolerance: Patient tolerated treatment well Patient left: in chair;with call bell/phone within reach;with family/visitor present Nurse Communication: Mobility status;Patient requests pain meds PT  Visit Diagnosis: Muscle weakness (generalized) (M62.81);Difficulty in walking, not elsewhere classified (R26.2);Other symptoms and signs involving the nervous system (R29.898)     Time: 8386-8358 PT Time Calculation (min) (ACUTE ONLY): 28 min  Charges:    $Therapeutic Activity: 8-22 mins PT General Charges $$ ACUTE PT VISIT: 1 Visit                    Darice Bohr, PTA  Darice JAYSON Bohr 01/17/2024, 5:05 PM

## 2024-01-17 NOTE — NC FL2 (Signed)
 " [place]  MEDICAID FL2 LEVEL OF CARE FORM     IDENTIFICATION  Patient Name: Francisco Knapp Birthdate: [birth year] Sex: male Admission Date (Current Location): 01/14/2024  [facility] and [place] Number:  Chiropodist and Address:  [facility], [address] [place], [place] [address]      Provider Number: [id number]  Attending Physician Name and Address:  Marsa Edelman, DO  Relative Name and Phone Number:  Rajan Burgard- son, [phone]    Current Level of Care: Hospital Recommended Level of Care: Skilled Nursing Facility Prior Approval Number:    Date Approved/Denied:   PASRR Number: [id number]  Discharge Plan: SNF    Current Diagnoses: Patient Active Problem List   Diagnosis Date Noted   Acute on chronic intracranial subdural hematoma (HCC) 01/15/2024   Essential hypertension 01/15/2024   Type 2 diabetes mellitus without complications (HCC) 01/15/2024   Dyslipidemia 01/15/2024   SDH (subdural hematoma) (HCC) 01/15/2024   TIA (transient ischemic attack) 02/05/2022   Hypertension 02/05/2022   Diabetes mellitus without complication (HCC) 02/05/2022   Hypercholesteremia 02/05/2022   Sensorineural hearing loss, bilateral 02/05/2022   CAD s/p CABG 2011 02/05/2022   CAD (coronary artery disease), autologous vein bypass graft 05/24/2013    Orientation RESPIRATION BLADDER Height & Weight     Self, Time, Situation, Place  Normal Continent Weight: 78.2 kg Height:  5' 11 (180.3 cm)  BEHAVIORAL SYMPTOMS/MOOD NEUROLOGICAL BOWEL NUTRITION STATUS      Continent Diet (Regular)  AMBULATORY STATUS COMMUNICATION OF NEEDS Skin   Extensive Assist Verbally Bruising, Other (Comment) (laceration right arm, petroleum guaze, abd pads, kerlix, and then tape.  Bruising to right forehead, both arms)                       Personal Care Assistance Level of Assistance  Bathing, Feeding, Dressing Bathing Assistance: Maximum  assistance Feeding assistance: Limited assistance Dressing Assistance: Maximum assistance     Functional Limitations Info  Sight, Hearing, Speech Sight Info: Adequate Hearing Info: Impaired (extremely HOH) Speech Info: Adequate    SPECIAL CARE FACTORS FREQUENCY  PT (By licensed PT), OT (By licensed OT)     PT Frequency: 5 times per week OT Frequency: 5 times per week            Contractures Contractures Info: Not present    Additional Factors Info  Code Status, Allergies Code Status Info: DNR Allergies Info: NKA           Current Medications (01/17/2024):  This is the current hospital active medication list Current Facility-Administered Medications  Medication Dose Route Frequency Provider Last Rate Last Admin   acetaminophen  (TYLENOL ) tablet 975 mg  975 mg Oral Q6H PRN Alexander, Natalie, DO   975 mg at 01/17/24 1329   Chlorhexidine  Gluconate Cloth 2 % PADS 6 each  6 each Topical Daily Alexander, Natalie, DO   6 each at 01/17/24 9071   feeding supplement (ENSURE PLUS HIGH PROTEIN) liquid 237 mL  237 mL Oral BID BM Alexander, Natalie, DO   237 mL at 01/17/24 1326   hydrALAZINE  (APRESOLINE ) injection 5 mg  5 mg Intravenous Q4H PRN Alexander, Natalie, DO       lidocaine  (LIDODERM ) 5 % 1-2 patch  1-2 patch Transdermal Daily PRN Alexander, Natalie, DO   1 patch at 01/16/24 1504   magnesium  hydroxide (MILK OF MAGNESIA) suspension 30 mL  30 mL Oral Daily PRN Mansy, Madison LABOR, MD  metFORMIN  (GLUCOPHAGE ) tablet 500 mg  500 mg Oral BID WC Alexander, Natalie, DO   500 mg at 01/17/24 9184   methocarbamol  (ROBAXIN ) tablet 250-500 mg  250-500 mg Oral Q6H PRN Alexander, Natalie, DO   500 mg at 01/16/24 8161   metoprolol  succinate (TOPROL -XL) 24 hr tablet 25 mg  25 mg Oral Daily Mansy, Jan A, MD   25 mg at 01/17/24 9071   ondansetron  (ZOFRAN ) tablet 4 mg  4 mg Oral Q6H PRN Mansy, Jan A, MD       Or   ondansetron  (ZOFRAN ) injection 4 mg  4 mg Intravenous Q6H PRN Mansy, Madison LABOR, MD        Oral care mouth rinse  15 mL Mouth Rinse PRN Marsa Edelman, DO       pantoprazole  (PROTONIX ) EC tablet 40 mg  40 mg Oral QHS Alexander, Natalie, DO   40 mg at 01/16/24 2127   ramipril  (ALTACE ) capsule 5 mg  5 mg Oral Daily Mansy, Jan A, MD   5 mg at 01/17/24 9071   senna-docusate (Senokot-S) tablet 1 tablet  1 tablet Oral BID Mansy, Jan A, MD   1 tablet at 01/17/24 9072   simvastatin  (ZOCOR ) tablet 40 mg  40 mg Oral QHS Mansy, Jan A, MD   40 mg at 01/16/24 2127   traZODone  (DESYREL ) tablet 25 mg  25 mg Oral QHS PRN Mansy, Madison LABOR, MD         Discharge Medications: Please see discharge summary for a list of discharge medications.  Relevant Imaging Results:  Relevant Lab Results:   Additional Information [id number]  Nathanael CHRISTELLA Ring, RN     "

## 2024-01-17 NOTE — Care Management Important Message (Signed)
 Important Message  Patient Details  Name: Francisco Knapp MRN: [id number] Date of Birth: [birth year]   Important Message Given:  Yes - Medicare IM     Simmie Garin W, CMA 01/17/2024, 1:01 PM

## 2024-01-17 NOTE — Progress Notes (Signed)
 Modified session to provide clear understanding and education on d/c process. Re-assured pt and pt's son that d/c would not happen this am per MD. Provided repositioning in bed to reduce pain and pt given ice to Right rib cage for pain relief. Will return in pm with OT to provide OOB activity and further education.   01/17/24 1115  PT Visit Information  Assistance Needed +1  History of Present Illness Pt is a 89 y.o. male admitted with acute on chronic SDH with trace midline shift towards R in the setting of multiple falls at home with skin tear on R elbow. R right lateral fifth through seventh rib fx. Per neurosurgery, not a surgical candidate at this time. PMH significant for DM type II, CABG.  Subjective Data  Subjective c/o severe pain and concerns re:d/c  Precautions  Precautions Fall  Recall of Precautions/Restrictions Intact  Precaution/Restrictions Comments  (Right 5, 6, 7,rib fractures)  Required Braces or Orthoses Other Brace (4inch ace wrap rib binder)  Restrictions  Weight Bearing Restrictions Per Provider Order No  Pain Assessment  Pain Assessment Faces  Faces Pain Scale 6  Pain Location back, R ribs/flank  Pain Descriptors / Indicators Aching;Discomfort;Grimacing  Pain Intervention(s) Limited activity within patient's tolerance;Ice applied  Cognition  Arousal Alert  Behavior During Therapy WFL for tasks assessed/performed  PT - Cognitive impairments No apparent impairments  Following Commands  Following commands Intact  Cueing  Cueing Techniques Verbal cues  Communication  Communication Impaired  Factors Affecting Communication Hearing impaired  Bed Mobility  General bed mobility comments  (Not formerly tested due to pain, pt repositioned to comfort and ice to Right ribs applied)  Transfers  General transfer comment NT  Ambulation/Gait  General Gait Details NT  Balance  Overall balance assessment Needs assistance  Sitting-balance support No upper extremity  supported;Feet supported  Sitting balance-Leahy Scale Good  General Comments  General comments (skin integrity, edema, etc.)  (Brief session focused on education and addressing pt and son's concerns regarding POC and potential d/c. Therapist deescalated situation resulting in better overall understanding of pt's hospital stay and dispo plans.)  Exercises  Exercises Other exercises  Other Exercises  Other Exercises Pt able to complete B ankle pumps in bed, limited R UE & B LE ROM due to triggering of rib pain. L UE WNL  PT - End of Session  Activity Tolerance Patient limited by pain  Patient left in bed;with call bell/phone within reach;with bed alarm set  Nurse Communication Mobility status;Patient requests pain meds   PT - Assessment/Plan  PT Visit Diagnosis Muscle weakness (generalized) (M62.81);Difficulty in walking, not elsewhere classified (R26.2);Other symptoms and signs involving the nervous system (R29.898)  PT Frequency (ACUTE ONLY) Min 2X/week  Follow Up Recommendations Skilled nursing-short term rehab (<3 hours/day)  Can patient physically be transported by private vehicle No  Patient can return home with the following A lot of help with walking and/or transfers;A lot of help with bathing/dressing/bathroom  PT equipment Rolling walker (2 wheels);BSC/3in1  AM-PAC PT 6 Clicks Mobility Outcome Measure (Version 2)  Help needed turning from your back to your side while in a flat bed without using bedrails? 3  Help needed moving from lying on your back to sitting on the side of a flat bed without using bedrails? 3  Help needed moving to and from a bed to a chair (including a wheelchair)? 2  Help needed standing up from a chair using your arms (e.g., wheelchair or bedside chair)? 2  Help needed to walk in hospital room? 2  Help needed climbing 3-5 steps with a railing?  2  6 Click Score 14  Consider Recommendation of Discharge To: CIR/SNF/LTACH  Progressive Mobility  What is the  highest level of mobility based on the mobility assessment? Level 3 (Stands with assistance) - Balance while standing  and cannot march in place  Activity Refused and notified nurse if applicable (Repositioned in bed)  PT Time Calculation  PT Start Time (ACUTE ONLY) 1100  PT Stop Time (ACUTE ONLY) 1114  PT Time Calculation (min) (ACUTE ONLY) 14 min  PT General Charges  $$ ACUTE PT VISIT 1 Visit  PT Treatments  $Therapeutic Activity 8-22 mins   Darice Bohr, PTA

## 2024-01-17 NOTE — TOC Progression Note (Signed)
 Transition of Care [facility]) - Progression Note    Patient Details  Name: BHARAT ANTILLON MRN: [id number] Date of Birth: March 15, 1923  [facility] CM/SW Contact  Nathanael CHRISTELLA Ring, RN Phone Number: 01/17/2024, 4:34 PM  Clinical Narrative:    Bed search started, spoke with son at the bedside, will present bed offers when available.     Expected Discharge Plan: Skilled Nursing Facility Barriers to Discharge: SNF Pending bed offer               Expected Discharge Plan and Services   Discharge Planning Services: CM Consult Post Acute Care Choice: Skilled Nursing Facility Living arrangements for the past 2 months: Single Family Home Expected Discharge Date: 01/16/24               DME Arranged: N/A                     Social Drivers of Health (SDOH) Interventions SDOH Screenings   Food Insecurity: No Food Insecurity (01/15/2024)  Housing: Low Risk (01/15/2024)  Transportation Needs: No Transportation Needs (01/15/2024)  Utilities: Not At Risk (01/15/2024)  Financial Resource Strain: Low Risk  (09/21/2023)   Received from [facility]  Social Connections: Socially Isolated (01/16/2024)  Tobacco Use: Low Risk (01/14/2024)    Readmission Risk Interventions     No data to display

## 2024-01-17 NOTE — Telephone Encounter (Signed)
 Francisco Fret, MD  [facility]-Neurosurgery Clinical; [facility]Neurosurgery Admin Will need noncontrast head CT in about 2 weeks  Patient is currently still in hospital, please order the following

## 2024-01-17 NOTE — TOC Progression Note (Signed)
 Transition of Care [facility]) - Progression Note    Patient Details  Name: Francisco Knapp MRN: [id number] Date of Birth: 07-02-1923  Transition [facility]) CM/SW Contact  Nathanael CHRISTELLA Ring, RN Phone Number: 01/17/2024, 2:53 PM  Clinical Narrative:     Discharge order has been discontinued.  Bed search started.    Expected Discharge Plan: Skilled Nursing Facility Barriers to Discharge: SNF Pending bed offer               Expected Discharge Plan and Services   Discharge Planning Services: CM Consult Post Acute Care Choice: Skilled Nursing Facility Living arrangements for the past 2 months: Single Family Home Expected Discharge Date: 01/16/24               DME Arranged: N/A                     Social Drivers of Health (SDOH) Interventions SDOH Screenings   Food Insecurity: No Food Insecurity (01/15/2024)  Housing: Low Risk (01/15/2024)  Transportation Needs: No Transportation Needs (01/15/2024)  Utilities: Not At Risk (01/15/2024)  Financial Resource Strain: Low Risk  (09/21/2023)   [facility]  Social Connections: Socially Isolated (01/16/2024)  Tobacco Use: Low Risk (01/14/2024)    Readmission Risk Interventions     No data to display

## 2024-01-18 DIAGNOSIS — I6203 Nontraumatic chronic subdural hemorrhage: Secondary | ICD-10-CM | POA: Diagnosis not present

## 2024-01-18 DIAGNOSIS — I6201 Nontraumatic acute subdural hemorrhage: Secondary | ICD-10-CM | POA: Diagnosis not present

## 2024-01-18 LAB — CBC
HCT: 30.7 % — ABNORMAL LOW (ref 39.0–52.0)
Hemoglobin: 10.2 g/dL — ABNORMAL LOW (ref 13.0–17.0)
MCH: 29.7 pg (ref 26.0–34.0)
MCHC: 33.2 g/dL (ref 30.0–36.0)
MCV: 89.2 fL (ref 80.0–100.0)
Platelets: 182 K/uL (ref 150–400)
RBC: 3.44 MIL/uL — ABNORMAL LOW (ref 4.22–5.81)
RDW: 12.8 % (ref 11.5–15.5)
WBC: 8.2 K/uL (ref 4.0–10.5)
nRBC: 0 % (ref 0.0–0.2)

## 2024-01-18 LAB — BASIC METABOLIC PANEL WITH GFR
Anion gap: 10 (ref 5–15)
BUN: 26 mg/dL — ABNORMAL HIGH (ref 8–23)
CO2: 24 mmol/L (ref 22–32)
Calcium: 9 mg/dL (ref 8.9–10.3)
Chloride: 102 mmol/L (ref 98–111)
Creatinine, Ser: 1.28 mg/dL — ABNORMAL HIGH (ref 0.61–1.24)
GFR, Estimated: 51 mL/min — ABNORMAL LOW
Glucose, Bld: 169 mg/dL — ABNORMAL HIGH (ref 70–99)
Potassium: 4.7 mmol/L (ref 3.5–5.1)
Sodium: 135 mmol/L (ref 135–145)

## 2024-01-18 MED ORDER — ENOXAPARIN SODIUM 40 MG/0.4ML IJ SOSY
40.0000 mg | PREFILLED_SYRINGE | INTRAMUSCULAR | Status: DC
  Administered 2024-01-18 – 2024-01-19 (×2): 40 mg via SUBCUTANEOUS
  Filled 2024-01-18 (×2): qty 0.4

## 2024-01-18 NOTE — TOC Progression Note (Signed)
 Transition of Care [facility]) - Progression Note    Patient Details  Name: Francisco Knapp MRN: [id number] Date of Birth: 12/22/1923  Transition of Care [facility]) CM/SW Contact  Nathanael CHRISTELLA Ring, RN Phone Number: 01/18/2024, 4:06 PM  Clinical Narrative:    Presented bed offers to son via phone, he asked about [facility] he said that he talked to them this morning and they said they had beds.  Reached out to Therisa at [facility] and she said she has been waiting on the son to call her back because he said patient needed LTC and they do not have and LTC beds right now.  Called son and told him to call Therisa back and then let me know the decision as we had not talked about LTC and only STR.    Expected Discharge Plan: Skilled Nursing Facility Barriers to Discharge: SNF Pending bed offer               Expected Discharge Plan and Services   Discharge Planning Services: CM Consult Post Acute Care Choice: Skilled Nursing Facility Living arrangements for the past 2 months: Single Family Home Expected Discharge Date: 01/16/24               DME Arranged: N/A                     Social Drivers of Health (SDOH) Interventions SDOH Screenings   Food Insecurity: No Food Insecurity (01/15/2024)  Housing: Low Risk (01/15/2024)  Transportation Needs: No Transportation Needs (01/15/2024)  Utilities: Not At Risk (01/15/2024)  Financial Resource Strain: Low Risk  (09/21/2023)   Received from [facility]  Social Connections: Socially Isolated (01/16/2024)  Tobacco Use: Low Risk (01/14/2024)    Readmission Risk Interventions     No data to display

## 2024-01-18 NOTE — Progress Notes (Signed)
 " PROGRESS NOTE    YONAH TANGEMAN   FMW:[id number] DOB: 1923/02/03  DOA: 01/14/2024 Date of Service: 01/18/2024 which is hospital day 3  PCP: Epifanio Alm SQUIBB, MD    Hospital course / significant events:   HPI: VIVIAN OKELLEY is a 89 y.o. 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  He states that he has been tripping.  He denied any presyncope or syncope.  Per his son he sits in the chair for a while and has been getting muscle atrophy.  He has been sports coach.  He fell about twice over the last couple weeks with head injury.  He denies any paresthesias or new focal muscle weakness.  No fever or chills.  No nausea or vomiting or abdominal pain.  He has been having back spasms.  No chest pain or palpitations.  No cough or wheezing or dyspnea.   12/26: to ED. BP was 167/86 with otherwise normal vital signs.  Labs revealed blood glucose of 180 and creatinine 1.26, high-sensitivity troponin T of 57 CBC showed leukocytosis 13.2 with neutrophilia, hemoglobin 11.3 hematocrit 34.9 close to previous levels.  UA with remarkable for 50 glucose and otherwise negative. EKG controlled afib. CT head 19:51 --> (+)acute on subacute L parieto-occipital SDH up to 1/4 cm, no midline shift or mass effect, (+)advanced cerebral atrophy, (+)small R frontotemporal scalp hematoma.  12/27: Repeat CT in 6h per neurosurgery --> (+)enlarging SDH noted but measures at 1.4 cm again... --> per neurosurgery nothing to do at this time. Rib XR (+)rib fx. 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 Will be placed on as needed IV labetalol for optimal BP control.    No concerns based on BMI: Body mass index is 23.98 kg/m.SABRA Significantly low or high BMI is associated with higher medical risk.  Underweight - under 18  overweight - 25 to 29 obese - 30 or more Class 1 obesity: BMI of 30.0 to 34 Class 2 obesity: BMI of 35.0 to 39 Class 3 obesity: BMI of 40.0 to 49 Super Morbid Obesity: BMI 50-59 Super-super Morbid Obesity: BMI 60+ Healthy nutrition and physical activity advised as adjunct to other disease management and risk reduction treatments    DVT prophylaxis: lovenox  IV fluids: no continuous IV fluids  Nutrition: carb modofied diet  Central lines / other devices: none  Code Status: DNR ACP documentation reviewed:  none on file in [facility]  [facility] needs: SNF rehab Medical barriers to dispo: none.              Subjective / Brief ROS:  Patient  reports feeing okay today  Still complaining of persistent rib pain, still not wanting much in the  way of medications for this other than Tylenol . Denies CP/SOB.  Pain controlled.  Denies new weakness.  Tolerating diet.  Reports no concerns w/ urination/defecation.   Family Communication: Son at bedside.  Of note, he stated that bed offers presented for rehab for clotted agreeable, I did advise him that typically in the case of available beds for rehab but  patient/family is declining these, we try to accommodate patient/family request regarding placement but cannot hold the patient indefinitely here while this is being sorted out.  Son verbalizes understanding.   Objective Findings:  Vitals:   01/17/24 2045 01/18/24 0422 01/18/24 0802 01/18/24 1621  BP: 136/71 135/83 131/70 106/62  Pulse: 76 80 82 79  Resp: 18 18 17 16   Temp: 98.1 F (36.7 C) 97.9 F (36.6 C) 98.4 F (36.9 C) 98.2 F (36.8 C)  TempSrc: Oral Oral Oral Oral  SpO2: 96% 94% 97% 99%  Weight:      Height:        Intake/Output Summary (Last 24 hours) at 01/18/2024 1716 Last data filed at 01/18/2024 9661 Gross per 24 hour  Intake 240 ml  Output 900 ml  Net -660 ml   Filed Weights   01/14/24 1810 01/15/24 0825  Weight: 78 kg 78.2 kg    Examination:  Physical Exam Constitutional:      General: He is not in acute distress. Cardiovascular:     Rate and Rhythm: Normal rate and regular rhythm.  Pulmonary:     Effort: Pulmonary effort is normal.     Breath sounds: Normal breath sounds.  Skin:    Comments: See photo  Neurological:     Mental Status: He is alert and oriented to person, place, and time. Mental status is at baseline.  Psychiatric:        Mood and Affect: Mood normal.        Behavior: Behavior normal.          Scheduled Medications:   Chlorhexidine  Gluconate Cloth  6 each Topical Daily   enoxaparin  (LOVENOX ) injection  40 mg Subcutaneous Q24H   feeding supplement  237 mL Oral BID BM   metFORMIN   500 mg Oral BID WC   metoprolol  succinate  25 mg Oral Daily   pantoprazole   40 mg Oral QHS   ramipril   5 mg Oral Daily   senna-docusate  1 tablet Oral BID   simvastatin   40 mg Oral QHS    Continuous Infusions:   PRN Medications:  acetaminophen , hydrALAZINE , lidocaine , magnesium  hydroxide, methocarbamol , ondansetron  **OR** ondansetron  (ZOFRAN ) IV, mouth rinse, traZODone   Antimicrobials from admission:  Anti-infectives (From admission,  onward)    None           Data Reviewed:  I have personally reviewed the following...  CBC: Recent Labs  Lab 01/14/24 2058 01/15/24 0600 01/18/24 0645  WBC 13.3* 10.6* 8.2  NEUTROABS 9.8*  --   --   HGB 11.3* 11.1* 10.2*  HCT 34.9* 33.9* 30.7*  MCV 92.1 91.1 89.2  PLT 199 194 182   Basic Metabolic Panel: Recent Labs  Lab 01/14/24 2058 01/15/24 0600 01/18/24 0645  NA 140 138 135  K 4.6 4.4 4.7  CL 104 105 102  CO2 23 24 24   GLUCOSE 180* 183* 169*  BUN 23 21 26*  CREATININE 1.26* 1.23 1.28*  CALCIUM 9.0 9.0 9.0   GFR: Estimated Creatinine Clearance: 34.3 mL/min (  A) (by C-G formula based on SCr of 1.28 mg/dL (H)). Liver Function Tests: No results for input(s): AST, ALT, ALKPHOS, BILITOT, PROT, ALBUMIN in the last 168 hours. No results for input(s): LIPASE, AMYLASE in the last 168 hours. No results for input(s): AMMONIA in the last 168 hours. Coagulation Profile: No results for input(s): INR, PROTIME in the last 168 hours. Cardiac Enzymes: No results for input(s): CKTOTAL, CKMB, CKMBINDEX, TROPONINI in the last 168 hours. BNP (last 3 results) No results for input(s): PROBNP in the last 8760 hours. HbA1C: No results for input(s): HGBA1C in the last 72 hours.  CBG: Recent Labs  Lab 01/16/24 2017 01/17/24 0058 01/17/24 0434 01/17/24 0739 01/17/24 1144  GLUCAP 84 93 131* 116* 281*   Lipid Profile: No results for input(s): CHOL, HDL, LDLCALC, TRIG, CHOLHDL, LDLDIRECT in the last 72 hours. Thyroid Function Tests: No results for input(s): TSH, T4TOTAL, FREET4, T3FREE, THYROIDAB in the last 72 hours. Anemia Panel: No results for input(s): VITAMINB12, FOLATE, FERRITIN, TIBC, IRON, RETICCTPCT in the last 72 hours. Most Recent Urinalysis On File:     Component Value Date/Time   COLORURINE YELLOW (A) 01/14/2024 2341   APPEARANCEUR CLEAR (A) 01/14/2024 2341   LABSPEC 1.015 01/14/2024 2341    PHURINE 6.0 01/14/2024 2341   GLUCOSEU 50 (A) 01/14/2024 2341   HGBUR SMALL (A) 01/14/2024 2341   BILIRUBINUR NEGATIVE 01/14/2024 2341   KETONESUR NEGATIVE 01/14/2024 2341   PROTEINUR NEGATIVE 01/14/2024 2341   NITRITE NEGATIVE 01/14/2024 2341   LEUKOCYTESUR NEGATIVE 01/14/2024 2341   Sepsis Labs: @LABRCNTIP (procalcitonin:4,lacticidven:4) Microbiology: Recent Results (from the past 240 hours)  MRSA Next Gen by PCR, Nasal     Status: None   Collection Time: 01/15/24  8:32 AM   Specimen: Nasal Mucosa; Nasal Swab  Result Value Ref Range Status   MRSA by PCR Next Gen NOT DETECTED NOT DETECTED Final    Comment: (NOTE) The GeneXpert MRSA Assay (FDA approved for NASAL specimens only), is one component of a comprehensive MRSA colonization surveillance program. It is not intended to diagnose MRSA infection nor to guide or monitor treatment for MRSA infections. Test performance is not FDA approved in patients less than 51 years old. Performed at [facility], [address], [place], [place] [address]       Radiology Studies last 3 days: DG Ribs Unilateral Right Result Date: 01/15/2024 CLINICAL DATA:  Rib pain after fall. EXAM: RIGHT RIBS - 2 VIEW COMPARISON:  None Available. FINDINGS: Minimally displaced fractures of right lateral fifth through seventh ribs. No pneumothorax or large pleural effusion. The right lung is clear. IMPRESSION: Minimally displaced fractures of right lateral fifth through seventh ribs. No pneumothorax. Electronically Signed   By: Andrea Gasman M.D.   On: 01/15/2024 17:07   CT HEAD WO CONTRAST ( ) Result Date: 01/15/2024 EXAM: CT HEAD WITHOUT CONTRAST 01/15/2024 08:15:00 AM TECHNIQUE: CT of the head was performed without the administration of intravenous contrast. Automated exposure control, iterative reconstruction, and/or weight based adjustment of the mA/kV was utilized to reduce the radiation dose to as low as reasonably achievable.  COMPARISON: Head CT 01/15/2024 01:57:00 AM and earlier. CLINICAL HISTORY: 89 year old male. Follow subdural hematoma - serial exam. Status post fall. FINDINGS: BRAIN AND VENTRICLES: Left hemisphere mixed density subdural hematoma measures up to 7 mm in thickness, stable. Small contralateral isodense right side subdural hematoma measures 2 to 3 mm in thickness, mildly redistributed but otherwise stable since initial presentation. Small volume of hyperdense blood redistributed along the tentorium is  incidentally noted. Trace rightward midline shift is stable (coronal image 20). Stable mild mass effect on the left lateral ventricle with no ventriculomegaly. Basilar cisterns remain patent. Calcified atherosclerosis at the skull base. No suspicious intracranial vascular hyperdensity. Stable gray white differentiation. No evidence of acute infarct. No hydrocephalus. ORBITS: No acute abnormality. SINUSES: No acute abnormality. SOFT TISSUES AND SKULL: Left anterior convexity broad based scalp hematoma appears stable on series 3 image 48. No skull fracture. IMPRESSION: 1. Stable left subdural hematoma (7 mm). Stable smaller right subdural hematoma (2-3 mm). Stable trace rightward midline shift. 2. No new intracranial abnormality. Electronically signed by: Helayne Hurst MD 01/15/2024 08:30 AM EST RP Workstation: [id number]   CT Head Wo Contrast Result Date: 01/15/2024 EXAM: CT HEAD WITHOUT CONTRAST 01/15/2024 02:05:47 AM TECHNIQUE: CT of the head was performed without the administration of intravenous contrast. Automated exposure control, iterative reconstruction, and/or weight based adjustment of the mA/kV was utilized to reduce the radiation dose to as low as reasonably achievable. COMPARISON: 01/14/2024 CLINICAL HISTORY: fall, SDH evaluation FINDINGS: BRAIN AND VENTRICLES: Acute subdural hematoma along left cerebral convexity measuring up to 1.4 cm, increased from prior 1.1 cm, with hyperdense blood now overlying the  entire left convexity. Associated increasing mass effect with effacement of the left lateral ventricle, sulcal crowding and effacement of left parietal and occipital sulci. No midline shift. New small extension of hemorrhage along left tentorium. Left posterior fossa arachnoid cyst. Mild cerebral atrophy. Patchy periventricular white matter hypodensities consistent with chronic microvascular ischemic disease. No evidence of acute infarct. No hydrocephalus. No other extra-axial collection. ORBITS: Bilateral lens replacement noted. SINUSES: No acute abnormality. SOFT TISSUES AND SKULL: Right frontal scalp contusion. No skull fracture. VASCULATURE: Moderate atherosclerotic calcification of carotid siphons and vertebral arteries. IMPRESSION: 1. Acute , enlarging left cerebral convexity subdural hematoma, increased to 1.4 cm, with increasing mass effect and new small extension along the left tentorium, without midline shift. 2. Right frontal scalp contusion. 3. Mild cerebral atrophy, chronic microvascular ischemic disease, left posterior fossa arachnoid cyst, moderate intracranial atherosclerotic calcification, and bilateral lens replacements. 4. These results will be called to the ordering clinician or representative by the radiologist assistant, and communication documented in the pacs or clario dashboard. Electronically signed by: Dorethia Molt MD 01/15/2024 02:15 AM EST RP Workstation: [id number]   CT Head Wo Contrast Result Date: 01/14/2024 EXAM: CT HEAD AND CERVICAL SPINE 01/14/2024 07:51:34 PM TECHNIQUE: CT of the head and cervical spine was performed without the administration of intravenous contrast. Multiplanar reformatted images are provided for review. Automated exposure control, iterative reconstruction, and/or weight based adjustment of the mA/kV was utilized to reduce the radiation dose to as low as reasonably achievable. COMPARISON: Head CT and MRI brain, both dated 02/05/2022. CTA head and neck and  reconstructions also from 1/18/. 24. CLINICAL HISTORY: Fall. FINDINGS: CT HEAD BRAIN AND VENTRICLES: There is an acute on subacute left hemispheric subdural hematoma. Hyperdense blood products measuring up to 1.4 cm in thickness on coronal reconstruction series 4, image 55 are noted along the posterolateral left parieto-occipital convexity merging with isodense blood products measuring 4 mm in thickness in the upper left insular area as measured on image 20 of axial series 2. There is local adjacent crowding of sulci, but no midline shift or downward mass effect due to preexisting atrophy. No further intracranial hemorrhage is seen. . Again noted is moderately advanced cerebral atrophy, small vessel disease, and atrophic ventriculomegaly with relatively mild cerebellar atrophy. There is a 2.2 cm arachnoid cyst  again seen laterally in the left posterior fossa without positive mass effect. There are small chronic lacunar infarcts in the left frontal deep white matter. No other old infarct is evident. No cortical-based acute infarct is seen. The carotid siphons and both distal vertebral arteries are heavily calcified with patchy basilar artery calcific plaques. No hyperdense vessel is seen. ORBITS: Old lens extractions with otherwise negative orbits. SINUSES AND MASTOIDS: No acute abnormality. SOFT TISSUES AND SKULL: Small right frontolateral scalp hematoma. No evidence of depressed skull fractures. CT CERVICAL SPINE BONES AND ALIGNMENT: Cervical spine demonstrates osteopenia without evidence of fractures or focal pathologic bone lesion. There is bone-on-bone anterior atlanto-dental joint space loss with osteophytes and degenerative cystic changes in the dens with retroodontoid pannus. Reversed cervical lordosis is again noted with minimal grade 1 degenerative anterolisthesis at the levels C2-C3, C3-C4, and C7-T1. No acute fracture or traumatic malalignment. DEGENERATIVE CHANGES: The cervical discs are chronically  collapsed with bidirectional osteophytes at C4-C5, C5-C6, and C6-C7. At C4-C5 and C5-C6, this is associated with spinal canal stenosis and mild to moderate spondylotic cord compression due to posterior osteophytes, unchanged . Other levels do not show significant cord encroachment. The discs are otherwise normal in height. Moderate facet joint and uncinate hypertrophy from C2-C3 through C5-C6, with bilateral acquired foraminal stenosis most severe at C3-C4, C4-C5 and C5-C6. SOFT TISSUES: No prevertebral soft tissue swelling. No spinal canal hematoma. There is a stable 1.4 cm heterogeneous nodule in the right lobe of the thyroid gland. There is no laryngeal mass. . There are heavy calcifications in the proximal cervical ICAs (internal carotid arteries). There are no acute upper thoracic findings. IMPRESSION: 1. Acute on subacute left parieto-occipital subdural hematoma up to 1.4 cm thickness with local adjacent crowding of sulci, but no midline shift or downward mass effect. 2. Moderately advanced cerebral atrophy and small vessel disease. Chronic arachnoid cyst left posterior fossa. 3. Small right frontolateral scalp hematoma without depressed skull fracture. 4. No acute fracture or traumatic malalignment of the cervical spine. 5. Degenerative cervical spondylosis with spinal canal stenosis and mild to moderate spondylotic cord compression at C4-5 and C5-6. 6. osteopenia and multilevel acquired foraminal stenosis. 7. Heavy Vascular calcifications. 8. Results discussed over the telephone with Dr. Floy at 8:27 pm, 01/14/24 with verbal acknowledgment of findings. Electronically signed by: Francis Quam MD 01/14/2024 08:32 PM EST RP Workstation: [id number]   CT Cervical Spine Wo Contrast Result Date: 01/14/2024 EXAM: CT HEAD AND CERVICAL SPINE 01/14/2024 07:51:34 PM TECHNIQUE: CT of the head and cervical spine was performed without the administration of intravenous contrast. Multiplanar reformatted images are  provided for review. Automated exposure control, iterative reconstruction, and/or weight based adjustment of the mA/kV was utilized to reduce the radiation dose to as low as reasonably achievable. COMPARISON: Head CT and MRI brain, both dated 02/05/2022. CTA head and neck and reconstructions also from 1/18/. 24. CLINICAL HISTORY: Fall. FINDINGS: CT HEAD BRAIN AND VENTRICLES: There is an acute on subacute left hemispheric subdural hematoma. Hyperdense blood products measuring up to 1.4 cm in thickness on coronal reconstruction series 4, image 55 are noted along the posterolateral left parieto-occipital convexity merging with isodense blood products measuring 4 mm in thickness in the upper left insular area as measured on image 20 of axial series 2. There is local adjacent crowding of sulci, but no midline shift or downward mass effect due to preexisting atrophy. No further intracranial hemorrhage is seen. . Again noted is moderately advanced cerebral atrophy, small vessel disease, and  atrophic ventriculomegaly with relatively mild cerebellar atrophy. There is a 2.2 cm arachnoid cyst again seen laterally in the left posterior fossa without positive mass effect. There are small chronic lacunar infarcts in the left frontal deep white matter. No other old infarct is evident. No cortical-based acute infarct is seen. The carotid siphons and both distal vertebral arteries are heavily calcified with patchy basilar artery calcific plaques. No hyperdense vessel is seen. ORBITS: Old lens extractions with otherwise negative orbits. SINUSES AND MASTOIDS: No acute abnormality. SOFT TISSUES AND SKULL: Small right frontolateral scalp hematoma. No evidence of depressed skull fractures. CT CERVICAL SPINE BONES AND ALIGNMENT: Cervical spine demonstrates osteopenia without evidence of fractures or focal pathologic bone lesion. There is bone-on-bone anterior atlanto-dental joint space loss with osteophytes and degenerative cystic changes  in the dens with retroodontoid pannus. Reversed cervical lordosis is again noted with minimal grade 1 degenerative anterolisthesis at the levels C2-C3, C3-C4, and C7-T1. No acute fracture or traumatic malalignment. DEGENERATIVE CHANGES: The cervical discs are chronically collapsed with bidirectional osteophytes at C4-C5, C5-C6, and C6-C7. At C4-C5 and C5-C6, this is associated with spinal canal stenosis and mild to moderate spondylotic cord compression due to posterior osteophytes, unchanged . Other levels do not show significant cord encroachment. The discs are otherwise normal in height. Moderate facet joint and uncinate hypertrophy from C2-C3 through C5-C6, with bilateral acquired foraminal stenosis most severe at C3-C4, C4-C5 and C5-C6. SOFT TISSUES: No prevertebral soft tissue swelling. No spinal canal hematoma. There is a stable 1.4 cm heterogeneous nodule in the right lobe of the thyroid gland. There is no laryngeal mass. . There are heavy calcifications in the proximal cervical ICAs (internal carotid arteries). There are no acute upper thoracic findings. IMPRESSION: 1. Acute on subacute left parieto-occipital subdural hematoma up to 1.4 cm thickness with local adjacent crowding of sulci, but no midline shift or downward mass effect. 2. Moderately advanced cerebral atrophy and small vessel disease. Chronic arachnoid cyst left posterior fossa. 3. Small right frontolateral scalp hematoma without depressed skull fracture. 4. No acute fracture or traumatic malalignment of the cervical spine. 5. Degenerative cervical spondylosis with spinal canal stenosis and mild to moderate spondylotic cord compression at C4-5 and C5-6. 6. osteopenia and multilevel acquired foraminal stenosis. 7. Heavy Vascular calcifications. 8. Results discussed over the telephone with Dr. Floy at 8:27 pm, 01/14/24 with verbal acknowledgment of findings. Electronically signed by: Francis Quam MD 01/14/2024 08:32 PM EST RP Workstation:  [id number]           Laneta Blunt, DO [facility] 01/18/2024, 5:16 PM    Dictation software may have been used to generate the above note. Typos may occur and escape review in typed/dictated notes. Please contact Dr Blunt directly for clarity if needed.  Staff may message me via secure chat in Epic  but this may not receive an immediate response,  please page me for urgent matters!  If 7PM-7AM, please contact night coverage [email or web address]       "

## 2024-01-18 NOTE — Plan of Care (Signed)
  Problem: Education: Goal: Knowledge of disease or condition will improve Outcome: Progressing Goal: Knowledge of secondary prevention will improve (MUST DOCUMENT ALL) Outcome: Progressing Goal: Knowledge of patient specific risk factors will improve (DELETE if not current risk factor) Outcome: Progressing   Problem: Intracerebral Hemorrhage Tissue Perfusion: Goal: Complications of Intracerebral Hemorrhage will be minimized Outcome: Progressing   Problem: Coping: Goal: Will verbalize positive feelings about self Outcome: Progressing Goal: Will identify appropriate support needs Outcome: Progressing   Problem: Health Behavior/Discharge Planning: Goal: Ability to manage health-related needs will improve Outcome: Progressing Goal: Goals will be collaboratively established with patient/family Outcome: Progressing   Problem: Self-Care: Goal: Ability to participate in self-care as condition permits will improve Outcome: Progressing Goal: Verbalization of feelings and concerns over difficulty with self-care will improve Outcome: Progressing Goal: Ability to communicate needs accurately will improve Outcome: Progressing   Problem: Nutrition: Goal: Risk of aspiration will decrease Outcome: Progressing Goal: Dietary intake will improve Outcome: Progressing   Problem: Education: Goal: Ability to describe self-care measures that may prevent or decrease complications (Diabetes Survival Skills Education) will improve Outcome: Progressing Goal: Individualized Educational Video(s) Outcome: Progressing   Problem: Coping: Goal: Ability to adjust to condition or change in health will improve Outcome: Progressing   Problem: Fluid Volume: Goal: Ability to maintain a balanced intake and output will improve Outcome: Progressing   Problem: Health Behavior/Discharge Planning: Goal: Ability to identify and utilize available resources and services will improve Outcome: Progressing Goal:  Ability to manage health-related needs will improve Outcome: Progressing   Problem: Metabolic: Goal: Ability to maintain appropriate glucose levels will improve Outcome: Progressing   Problem: Nutritional: Goal: Maintenance of adequate nutrition will improve Outcome: Progressing Goal: Progress toward achieving an optimal weight will improve Outcome: Progressing   Problem: Skin Integrity: Goal: Risk for impaired skin integrity will decrease Outcome: Progressing   Problem: Tissue Perfusion: Goal: Adequacy of tissue perfusion will improve Outcome: Progressing   Problem: Education: Goal: Knowledge of General Education information will improve Description: Including pain rating scale, medication(s)/side effects and non-pharmacologic comfort measures Outcome: Progressing   Problem: Health Behavior/Discharge Planning: Goal: Ability to manage health-related needs will improve Outcome: Progressing   Problem: Clinical Measurements: Goal: Ability to maintain clinical measurements within normal limits will improve Outcome: Progressing Goal: Will remain free from infection Outcome: Progressing Goal: Diagnostic test results will improve Outcome: Progressing Goal: Respiratory complications will improve Outcome: Progressing Goal: Cardiovascular complication will be avoided Outcome: Progressing   Problem: Activity: Goal: Risk for activity intolerance will decrease Outcome: Progressing   Problem: Nutrition: Goal: Adequate nutrition will be maintained Outcome: Progressing   Problem: Coping: Goal: Level of anxiety will decrease Outcome: Progressing   Problem: Elimination: Goal: Will not experience complications related to bowel motility Outcome: Progressing Goal: Will not experience complications related to urinary retention Outcome: Progressing   Problem: Pain Managment: Goal: General experience of comfort will improve and/or be controlled Outcome: Progressing   Problem:  Safety: Goal: Ability to remain free from injury will improve Outcome: Progressing   Problem: Skin Integrity: Goal: Risk for impaired skin integrity will decrease Outcome: Progressing

## 2024-01-18 NOTE — Progress Notes (Signed)
" °  Chaplain On-Call received a call from RN Asberry, who reported that a family member of the patient is visiting and is interested in discussing the Advance Directives information that this Chaplain left with the patient earlier today.  Chaplain attempted to visit, but  the patient was receiving Occupational Therapy treatment in the room.  Meanwhile, this Chaplain was paged to attend a Rapid Response event on another Unit. Chaplain assured Staff of his return as soon as possible after the Rapid Response.  Chaplain Bebe Lay M.Div., [facility]  "

## 2024-01-18 NOTE — Progress Notes (Signed)
" °  Chaplain On-Call responded to Spiritual Care Consult Order from Laneta Blunt, MD. The request was for Advance Directives information for the patient.  Chaplain met the patient and offered spiritual and emotional support. Chaplain provided the Advance Directives documents and education to the patient, who stated his understanding.  Chaplain  Bebe Ardean EMERSON Hershal., BCC "

## 2024-01-18 NOTE — Progress Notes (Signed)
 Physical Therapy Treatment Patient Details Name: Francisco Knapp MRN: [id number] DOB: 05/26/1923 Today's Date: 01/18/2024   History of Present Illness Pt is a 89 y.o. male admitted with acute on chronic SDH with trace midline shift towards R in the setting of multiple falls at home with skin tear on R elbow. R right lateral fifth through seventh rib fx. Per neurosurgery, not a surgical candidate at this time. PMH significant for DM type II, CABG.    PT Comments  Pt received in bed, son at bedside for co-tx with OT in order to safely progress functional mobility. Pt continues to improve with transfers as Right rib pain slowly decreases. Ace wraps replaced with Rib binder for improved comfort and skin integrity. Progressed gait training with RW, CG/MinA for safety and management 15'x1 & 20'x1. No LOB or c/o dizziness. Good overall tolerance for activity, remains very motivated and cooperative during every session. All questions and concerns addressed with pt and pt's son. Will continue to progress acutely.     If plan is discharge home, recommend the following: A lot of help with walking and/or transfers;A lot of help with bathing/dressing/bathroom   Can travel by private vehicle     No  Equipment Recommendations  Rolling walker (2 wheels);BSC/3in1    Recommendations for Other Services       Precautions / Restrictions Precautions Precautions: Fall Recall of Precautions/Restrictions: Intact Precaution/Restrictions Comments: R 5-7 rib fxs Required Braces or Orthoses: Other Brace Other Brace: Rib fx binder Restrictions Weight Bearing Restrictions Per Provider Order: No     Mobility  Bed Mobility Overal bed mobility: Needs Assistance Bed Mobility: Supine to Sit     Supine to sit: HOB elevated, Used rails, Min assist     General bed mobility comments: increased time and cues, Min A via HHA provided for pain management    Transfers Overall transfer level: Needs  assistance Equipment used: Rolling walker (2 wheels) Transfers: Sit to/from Stand Sit to Stand: Min assist           General transfer comment: cues for hand placement to stand from EOB and comfort height toilet during session for lift off assist; +2 present for safety and pain management as pt with progression in mobility this date; assist for RW proximity and management/obstable manuevering during sesion    Ambulation/Gait Ambulation/Gait assistance: Contact guard assist Gait Distance (Feet): 20 Feet Assistive device: Rolling walker (2 wheels) Gait Pattern/deviations: Step-through pattern, Trunk flexed Gait velocity: decr     General Gait Details:  (Heavy reliance on RW, slightly flexed due to low walker - will adjust)   Stairs             Wheelchair Mobility     Tilt Bed    Modified Rankin (Stroke Patients Only)       Balance Overall balance assessment: Needs assistance Sitting-balance support: No upper extremity supported, Feet supported Sitting balance-Leahy Scale: Good     Standing balance support: Bilateral upper extremity supported, During functional activity, Reliant on assistive device for balance Standing balance-Leahy Scale: Fair Standing balance comment: RW, Min/CGA for safety                            Communication Communication Communication: Impaired Factors Affecting Communication: Hearing impaired  Cognition Arousal: Alert Behavior During Therapy: WFL for tasks assessed/performed   PT - Cognitive impairments: No apparent impairments  PT - Cognition Comments:  (Very pleasant and cooperative) Following commands: Intact      Cueing Cueing Techniques: Verbal cues  Exercises Other Exercises Other Exercises:  (Pt and son educated on role of acute PT, transition to STR, benefits of utilizing IS for lung expansion due to guarding. All questions and concerns addressed.)    General Comments  General comments (skin integrity, edema, etc.):  (Right forearm skin tear wrapped and intact. Healing Right lateral forehead brusie. New Rib fx binder issued with improved tolerance and relief)      Pertinent Vitals/Pain Pain Assessment Pain Assessment: Faces Faces Pain Scale: Hurts little more Pain Location: continues to have occasional spasms that cause him to grimace/groan Pain Descriptors / Indicators: Aching, Discomfort, Grimacing, Spasm Pain Intervention(s): Limited activity within patient's tolerance    Home Living                          Prior Function            PT Goals (current goals can now be found in the care plan section) Acute Rehab PT Goals Patient Stated Goal:  (Get stronger, reduce pain) Progress towards PT goals: Progressing toward goals    Frequency    Min 2X/week      PT Plan      Co-evaluation   Reason for Co-Treatment: For patient/therapist safety;To address functional/ADL transfers PT goals addressed during session: Mobility/safety with mobility OT goals addressed during session: ADL's and self-care      AM-PAC PT 6 Clicks Mobility   Outcome Measure  Help needed turning from your back to your side while in a flat bed without using bedrails?: A Lot Help needed moving from lying on your back to sitting on the side of a flat bed without using bedrails?: A Little Help needed moving to and from a bed to a chair (including a wheelchair)?: A Little Help needed standing up from a chair using your arms (e.g., wheelchair or bedside chair)?: A Little Help needed to walk in hospital room?: A Little Help needed climbing 3-5 steps with a railing? : A Lot 6 Click Score: 16    End of Session Equipment Utilized During Treatment: Gait belt Activity Tolerance: Patient tolerated treatment well Patient left: in chair;with call bell/phone within reach;with family/visitor present Nurse Communication: Mobility status;Patient requests pain meds PT  Visit Diagnosis: Muscle weakness (generalized) (M62.81);Difficulty in walking, not elsewhere classified (R26.2);Other symptoms and signs involving the nervous system (R29.898)     Time: 8789-8764 PT Time Calculation (min) (ACUTE ONLY): 25 min  Charges:    $Therapeutic Activity: 8-22 mins PT General Charges $$ ACUTE PT VISIT: 1 Visit                    Darice Bohr, PTA  Darice JAYSON Bohr 01/18/2024, 6:51 PM

## 2024-01-18 NOTE — Plan of Care (Signed)
" °  Problem: Education: Goal: Knowledge of disease or condition will improve Outcome: Progressing Goal: Knowledge of secondary prevention will improve (MUST DOCUMENT ALL) Outcome: Progressing Goal: Knowledge of patient specific risk factors will improve (DELETE if not current risk factor) Outcome: Progressing   Problem: Intracerebral Hemorrhage Tissue Perfusion: Goal: Complications of Intracerebral Hemorrhage will be minimized Outcome: Progressing   Problem: Coping: Goal: Will verbalize positive feelings about self Outcome: Progressing Goal: Will identify appropriate support needs Outcome: Progressing   Problem: Health Behavior/Discharge Planning: Goal: Ability to manage health-related needs will improve Outcome: Progressing Goal: Goals will be collaboratively established with patient/family Outcome: Progressing   Problem: Self-Care: Goal: Ability to participate in self-care as condition permits will improve Outcome: Progressing Goal: Verbalization of feelings and concerns over difficulty with self-care will improve Outcome: Progressing Goal: Ability to communicate needs accurately will improve Outcome: Progressing   Problem: Nutrition: Goal: Risk of aspiration will decrease Outcome: Progressing Goal: Dietary intake will improve Outcome: Progressing   Problem: Fluid Volume: Goal: Ability to maintain a balanced intake and output will improve Outcome: Progressing   Problem: Health Behavior/Discharge Planning: Goal: Ability to identify and utilize available resources and services will improve Outcome: Progressing Goal: Ability to manage health-related needs will improve Outcome: Progressing   Problem: Metabolic: Goal: Ability to maintain appropriate glucose levels will improve Outcome: Progressing   Problem: Nutritional: Goal: Maintenance of adequate nutrition will improve Outcome: Progressing Goal: Progress toward achieving an optimal weight will improve Outcome:  Progressing   Problem: Skin Integrity: Goal: Risk for impaired skin integrity will decrease Outcome: Progressing   "

## 2024-01-18 NOTE — Progress Notes (Signed)
 Occupational Therapy Treatment Patient Details Name: Francisco Knapp MRN: [id number] DOB: 1923-07-03 Today's Date: 01/18/2024   History of present illness Pt is a 89 y.o. male admitted with acute on chronic SDH with trace midline shift towards R in the setting of multiple falls at home with skin tear on R elbow. R right lateral fifth through seventh rib fx. Per neurosurgery, not a surgical candidate at this time. PMH significant for DM type II, CABG.   OT comments  Pt is supine in bed on arrival. Pleasant and agreeable to OT/PT co-tx session. He continues to have back spasms and rib pain, ace bandage removed for skin check. Pt performed bed mobility with Min A via HHA, increased time and cues. Pt required Min A for STS for lift off and cueing for hand placement from EOB and toilet during session. Progressed mobility this date ~20 ft using RW with Min A +2 for safety with RW management and proximity, as well as obstacle maneuvering. Standing grooming tasks performed at sink in bathroom with CGA and constant unilateral support on sink. Pt remains a high fall risk. Pt returned to recliner with all needs in place and will cont to require skilled acute OT services to maximize his safety and IND to return to PLOF.       If plan is discharge home, recommend the following:  A lot of help with bathing/dressing/bathroom;Assist for transportation;Help with stairs or ramp for entrance;A little help with walking and/or transfers   Equipment Recommendations  Other (comment);BSC/3in1 (RW; defer to next venue)    Recommendations for Other Services      Precautions / Restrictions Precautions Precautions: Fall Recall of Precautions/Restrictions: Intact Precaution/Restrictions Comments: R 5-7 rib fxs (Right 5, 6, 7,rib fractures) Required Braces or Orthoses: Other Brace (4inch ace wrap rib binder) Other Brace: ace wrap rib binder removed for skin check Restrictions Weight Bearing Restrictions Per Provider  Order: No       Mobility Bed Mobility Overal bed mobility: Needs Assistance Bed Mobility: Supine to Sit     Supine to sit: HOB elevated, Used rails, Min assist     General bed mobility comments: increased time and cues, Min A via HHA provided for pain management    Transfers Overall transfer level: Needs assistance Equipment used: Rolling walker (2 wheels) Transfers: Sit to/from Stand Sit to Stand: Min assist           General transfer comment: cues for hand placement to stand from EOB and comfort height toilet during session for lift off assist; +2 present for safety and pain management as pt with progression in mobility this date; assist for RW proximity and management/obstable manuevering during sesion     Balance Overall balance assessment: Needs assistance Sitting-balance support: No upper extremity supported, Feet supported Sitting balance-Leahy Scale: Good     Standing balance support: Bilateral upper extremity supported, During functional activity, Reliant on assistive device for balance Standing balance-Leahy Scale: Poor Standing balance comment: RW, Min/CGA for safety                           ADL either performed or assessed with clinical judgement   ADL Overall ADL's : Needs assistance/impaired     Grooming: Oral care;Contact guard assist;Standing;Wash/dry face Grooming Details (indicate cue type and reason): at bathroom sink                 Toilet Transfer: Minimal assistance;Rolling walker (2 wheels);Cueing for sequencing;Cueing  for safety;Ambulation;Comfort height toilet Toilet Transfer Details (indicate cue type and reason): cues for hand placement, lift off assist required         Functional mobility during ADLs: Minimal assistance;Rolling walker (2 wheels);Cueing for sequencing;Cueing for safety      Extremity/Trunk Assessment              Vision       Perception     Praxis     Communication  Communication Communication: Impaired Factors Affecting Communication: Hearing impaired   Cognition Arousal: Alert Behavior During Therapy: WFL for tasks assessed/performed                                 Following commands: Intact        Cueing   Cueing Techniques: Verbal cues  Exercises      Shoulder Instructions       General Comments bruising to R lower back, continued spasms    Pertinent Vitals/ Pain       Pain Assessment Pain Assessment: Faces Faces Pain Scale: Hurts little more Pain Location: continues to have occasional spasms that cause him to grimace/groan Pain Descriptors / Indicators: Aching, Discomfort, Grimacing, Spasm Pain Intervention(s): Limited activity within patient's tolerance, Monitored during session, Repositioned, Patient requesting pain meds-RN notified  Home Living                                          Prior Functioning/Environment              Frequency  Min 2X/week        Progress Toward Goals  OT Goals(current goals can now be found in the care plan section)  Progress towards OT goals: Progressing toward goals  Acute Rehab OT Goals OT Goal Formulation: With patient/family Time For Goal Achievement: 01/30/24 Potential to Achieve Goals: Good  Plan      Co-evaluation    PT/OT/SLP Co-Evaluation/Treatment: Yes Reason for Co-Treatment: For patient/therapist safety;To address functional/ADL transfers PT goals addressed during session: Mobility/safety with mobility OT goals addressed during session: ADL's and self-care      AM-PAC OT 6 Clicks Daily Activity     Outcome Measure   Help from another person eating meals?: A Little Help from another person taking care of personal grooming?: A Little Help from another person toileting, which includes using toliet, bedpan, or urinal?: A Lot Help from another person bathing (including washing, rinsing, drying)?: A Lot Help from another person  to put on and taking off regular upper body clothing?: A Little Help from another person to put on and taking off regular lower body clothing?: A Lot 6 Click Score: 15    End of Session Equipment Utilized During Treatment: Rolling walker (2 wheels);Gait belt  OT Visit Diagnosis: Repeated falls (R29.6);Muscle weakness (generalized) (M62.81);Pain;History of falling (Z91.81);Unsteadiness on feet (R26.81) Pain - Right/Left: Right Pain - part of body:  (Ribs)   Activity Tolerance Patient tolerated treatment well   Patient Left in chair;with call bell/phone within reach;with chair alarm set;with family/visitor present   Nurse Communication Mobility status        Time: 8789-8764 OT Time Calculation (min): 25 min  Charges: OT General Charges $OT Visit: 1 Visit OT Treatments $Self Care/Home Management : 8-22 mins  Talisa Petrak Chrismon, OTR/L  01/18/2024, 2:05 PM   Anyela Napierkowski E  Chrismon 01/18/2024, 1:58 PM

## 2024-01-19 ENCOUNTER — Other Ambulatory Visit: Payer: Self-pay

## 2024-01-19 DIAGNOSIS — S065XAA Traumatic subdural hemorrhage with loss of consciousness status unknown, initial encounter: Secondary | ICD-10-CM

## 2024-01-19 DIAGNOSIS — I6203 Nontraumatic chronic subdural hemorrhage: Secondary | ICD-10-CM | POA: Diagnosis not present

## 2024-01-19 DIAGNOSIS — I6201 Nontraumatic acute subdural hemorrhage: Secondary | ICD-10-CM | POA: Diagnosis not present

## 2024-01-19 LAB — GLUCOSE, CAPILLARY: Glucose-Capillary: 154 mg/dL — ABNORMAL HIGH (ref 70–99)

## 2024-01-19 NOTE — Plan of Care (Signed)
  Problem: Education: Goal: Knowledge of disease or condition will improve Outcome: Progressing Goal: Knowledge of secondary prevention will improve (MUST DOCUMENT ALL) Outcome: Progressing Goal: Knowledge of patient specific risk factors will improve (DELETE if not current risk factor) Outcome: Progressing   Problem: Intracerebral Hemorrhage Tissue Perfusion: Goal: Complications of Intracerebral Hemorrhage will be minimized Outcome: Progressing   Problem: Coping: Goal: Will verbalize positive feelings about self Outcome: Progressing Goal: Will identify appropriate support needs Outcome: Progressing   Problem: Health Behavior/Discharge Planning: Goal: Ability to manage health-related needs will improve Outcome: Progressing Goal: Goals will be collaboratively established with patient/family Outcome: Progressing   Problem: Self-Care: Goal: Ability to participate in self-care as condition permits will improve Outcome: Progressing Goal: Verbalization of feelings and concerns over difficulty with self-care will improve Outcome: Progressing Goal: Ability to communicate needs accurately will improve Outcome: Progressing   Problem: Nutrition: Goal: Risk of aspiration will decrease Outcome: Progressing Goal: Dietary intake will improve Outcome: Progressing   Problem: Education: Goal: Ability to describe self-care measures that may prevent or decrease complications (Diabetes Survival Skills Education) will improve Outcome: Progressing Goal: Individualized Educational Video(s) Outcome: Progressing   Problem: Coping: Goal: Ability to adjust to condition or change in health will improve Outcome: Progressing   Problem: Fluid Volume: Goal: Ability to maintain a balanced intake and output will improve Outcome: Progressing   Problem: Health Behavior/Discharge Planning: Goal: Ability to identify and utilize available resources and services will improve Outcome: Progressing Goal:  Ability to manage health-related needs will improve Outcome: Progressing   Problem: Metabolic: Goal: Ability to maintain appropriate glucose levels will improve Outcome: Progressing   Problem: Nutritional: Goal: Maintenance of adequate nutrition will improve Outcome: Progressing Goal: Progress toward achieving an optimal weight will improve Outcome: Progressing   Problem: Skin Integrity: Goal: Risk for impaired skin integrity will decrease Outcome: Progressing   Problem: Tissue Perfusion: Goal: Adequacy of tissue perfusion will improve Outcome: Progressing   Problem: Education: Goal: Knowledge of General Education information will improve Description: Including pain rating scale, medication(s)/side effects and non-pharmacologic comfort measures Outcome: Progressing   Problem: Health Behavior/Discharge Planning: Goal: Ability to manage health-related needs will improve Outcome: Progressing   Problem: Clinical Measurements: Goal: Ability to maintain clinical measurements within normal limits will improve Outcome: Progressing Goal: Will remain free from infection Outcome: Progressing Goal: Diagnostic test results will improve Outcome: Progressing Goal: Respiratory complications will improve Outcome: Progressing Goal: Cardiovascular complication will be avoided Outcome: Progressing   Problem: Activity: Goal: Risk for activity intolerance will decrease Outcome: Progressing   Problem: Nutrition: Goal: Adequate nutrition will be maintained Outcome: Progressing   Problem: Coping: Goal: Level of anxiety will decrease Outcome: Progressing   Problem: Elimination: Goal: Will not experience complications related to bowel motility Outcome: Progressing Goal: Will not experience complications related to urinary retention Outcome: Progressing   Problem: Pain Managment: Goal: General experience of comfort will improve and/or be controlled Outcome: Progressing   Problem:  Safety: Goal: Ability to remain free from injury will improve Outcome: Progressing   Problem: Skin Integrity: Goal: Risk for impaired skin integrity will decrease Outcome: Progressing

## 2024-01-19 NOTE — Telephone Encounter (Signed)
"  Patient is still admitted.   "

## 2024-01-19 NOTE — Progress Notes (Signed)
 Physical Therapy Treatment Patient Details Name: Francisco Knapp MRN: [id number] DOB: 24-Dec-1923 Today's Date: 01/19/2024   History of Present Illness Pt is a 89 y.o. male admitted with acute on chronic SDH with trace midline shift towards R in the setting of multiple falls at home with skin tear on R elbow. R right lateral fifth through seventh rib fx. Per neurosurgery, not a surgical candidate at this time. PMH significant for DM type II, CABG.    PT Comments  Gradual progression in gait distance and overall activity tolerance.  Continues to require RW and +1 assist with all mobility; anticipating discharge to STR next date.  Slightly impulsive, but easily redirectable.  Self-directs preferred movement patterns to minimize pain in R flank.      If plan is discharge home, recommend the following: A lot of help with walking and/or transfers;A lot of help with bathing/dressing/bathroom   Can travel by private vehicle     No  Equipment Recommendations  Rolling walker (2 wheels);BSC/3in1    Recommendations for Other Services       Precautions / Restrictions Precautions Precautions: Fall Recall of Precautions/Restrictions: Intact Precaution/Restrictions Comments: R 5-7 rib fxs Required Braces or Orthoses: Other Brace Other Brace: Rib fx binder Restrictions Weight Bearing Restrictions Per Provider Order: No     Mobility  Bed Mobility Overal bed mobility: Needs Assistance Bed Mobility: Supine to Sit     Supine to sit: Min assist          Transfers Overall transfer level: Needs assistance Equipment used: Rolling walker (2 wheels) Transfers: Sit to/from Stand Sit to Stand: Min assist           General transfer comment: cuing for hand placement-prefers to place R UE on RW and L UE on bed surface during movement transitions (for optimal pain control)    Ambulation/Gait Ambulation/Gait assistance: Min assist Gait Distance (Feet): 25 Feet (x2) Assistive device:  Rolling walker (2 wheels)         General Gait Details: reciprocal stepping pattern; forward flexed posture, RW arms length anterior to patient.  Slightly impulsive, but redirectable. Distance limited by generalized fatigue.   Stairs             Wheelchair Mobility     Tilt Bed    Modified Rankin (Stroke Patients Only)       Balance Overall balance assessment: Needs assistance Sitting-balance support: No upper extremity supported, Feet supported Sitting balance-Leahy Scale: Good     Standing balance support: Bilateral upper extremity supported Standing balance-Leahy Scale: Fair                              Musician Factors Affecting Communication: Hearing impaired  Cognition Arousal: Alert Behavior During Therapy: WFL for tasks assessed/performed   PT - Cognitive impairments: No apparent impairments                         Following commands: Intact      Cueing Cueing Techniques: Verbal cues  Exercises Other Exercises Other Exercises: Toilet transfer, ambulatory with RW, min assist; sit/stand from [facility] (over toilet), min assist; standing balacne for hygiene, min assist with bilat UE support on RW; dep for hygiene.    General Comments        Pertinent Vitals/Pain Pain Assessment Pain Assessment: Faces Faces Pain Scale: Hurts little more Pain Location: R flank pain Pain Descriptors / Indicators:  Aching, Sore Pain Intervention(s): Limited activity within patient's tolerance, Monitored during session, Repositioned    Home Living                          Prior Function            PT Goals (current goals can now be found in the care plan section) Acute Rehab PT Goals Patient Stated Goal: to get stronger before I go back home PT Goal Formulation: With patient Time For Goal Achievement: 01/29/24 Potential to Achieve Goals: Good Progress towards PT goals: Progressing toward goals     Frequency    Min 2X/week      PT Plan      Co-evaluation              AM-PAC PT 6 Clicks Mobility   Outcome Measure  Help needed turning from your back to your side while in a flat bed without using bedrails?: A Little Help needed moving from lying on your back to sitting on the side of a flat bed without using bedrails?: A Little Help needed moving to and from a bed to a chair (including a wheelchair)?: A Little Help needed standing up from a chair using your arms (e.g., wheelchair or bedside chair)?: A Little Help needed to walk in hospital room?: A Little Help needed climbing 3-5 steps with a railing? : A Lot 6 Click Score: 17    End of Session Equipment Utilized During Treatment: Gait belt Activity Tolerance: Patient tolerated treatment well Patient left: in chair;with call bell/phone within reach;with family/visitor present Nurse Communication: Mobility status;Patient requests pain meds PT Visit Diagnosis: Muscle weakness (generalized) (M62.81);Difficulty in walking, not elsewhere classified (R26.2);Other symptoms and signs involving the nervous system (R29.898)     Time: 8452-8380 PT Time Calculation (min) (ACUTE ONLY): 32 min  Charges:    $Gait Training: 8-22 mins $Therapeutic Activity: 8-22 mins PT General Charges $$ ACUTE PT VISIT: 1 Visit                     Ruchy Wildrick H. Delores, PT, DPT, NCS 01/19/2024, 5:49 PM [phone]

## 2024-01-19 NOTE — Progress Notes (Signed)
 [facility] Progress Note  Patient: Francisco Knapp    FMW:[id number]  DOA: 01/14/2024     Date of Service: the patient was seen and examined on 01/19/2024  Chief Complaint  Patient presents with   Fall    PT to ER via EMS from home after a mechanical fall injuring right arm with a skin tear and an abrasion to right forehead - No LOC - No blood thinners   Brief hospital course:  Francisco Knapp is a 89 y.o. 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  He states that he has been tripping.  He denied any presyncope or syncope.  Per his son he sits in the chair for a while and has been getting muscle atrophy.  He has been sports coach.  He fell about twice over the last couple weeks with head injury.  He denies any paresthesias or new focal muscle weakness.  No fever or chills.  No nausea or vomiting or abdominal pain.  He has been having back spasms.  No chest pain or palpitations.  No cough or wheezing or dyspnea.    12/26: to ED. BP was 167/86 with otherwise normal vital signs.  Labs revealed blood glucose of 180 and creatinine 1.26, high-sensitivity troponin T of 57 CBC showed leukocytosis 13.2 with neutrophilia, hemoglobin 11.3 hematocrit 34.9 close to previous levels.  UA with remarkable for 50 glucose and otherwise negative. EKG controlled afib. CT head 19:51 --> (+)acute on subacute L parieto-occipital SDH up to 1/4 cm, no midline shift or mass effect, (+)advanced cerebral atrophy, (+)small R frontotemporal scalp hematoma.  12/27: Repeat CT in 6h per neurosurgery --> (+)enlarging SDH noted but measures at 1.4 cm again... --> per neurosurgery nothing to do at this time. Rib XR (+)rib fx. 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 Will be placed on as needed IV labetalol for optimal BP control.         Body mass index is 24.04 kg/m.  Interventions:   Diet: Carb modified diet DVT Prophylaxis: Subcutaneous Lovenox    Advance goals of care discussion: DNR-limited  Family Communication: family was present at bedside, at the time of interview.  The pt provided permission to discuss medical plan with the family. Opportunity was given to ask question and all questions were answered satisfactorily.   Disposition:  Pt is from Home, admitted with Fall and SDH, stable to discharge to SNF. Discharge to SNF, when bed will be available.  Most likely tomorrow.  Subjective: No significant events overnight.  Patient was complaining of headache  off and on, feels pressure and fullness on the right side.  Sometimes dizziness, and also felt some nausea, but not all the time. Denied any other complaints.  Physical Exam: General: NAD, lying comfortably Appear in no distress, affect appropriate Eyes: PERRLA ENT: Oral Mucosa Clear, moist  Neck: no JVD,  Cardiovascular: S1 and S2 Present, no Murmur,  Respiratory: good respiratory effort, Bilateral Air entry equal and Decreased, no Crackles, no  wheezes Abdomen: Bowel Sound present, Soft and no tenderness,  Skin: no rashes Extremities: no Pedal edema, no calf tenderness Neurologic: without any new focal findings Gait not checked due to patient safety concerns  Vitals:   01/18/24 2042 01/19/24 0517 01/19/24 0833 01/19/24 1629  BP: 111/65 130/73 131/74 (!) 107/59  Pulse: 76 70 79 84  Resp: 16 15 18 20   Temp: 97.6 F (36.4 C) 97.7 F (36.5 C)  97.7 F (36.5 C)  TempSrc: Oral Oral  Oral  SpO2: 96% 98% 98% 99%  Weight:      Height:        Intake/Output Summary (Last 24 hours) at 01/19/2024 1731 Last data filed at 01/19/2024 1500 Gross per 24 hour  Intake 720 ml  Output 650 ml  Net 70 ml   Filed Weights   01/14/24 1810 01/15/24 0825  Weight: 78 kg 78.2 kg    Data Reviewed: I have personally reviewed and interpreted daily labs, tele strips, imagings as discussed above. I reviewed all nursing notes, pharmacy notes, vitals, pertinent old records I have discussed plan of care as described above with RN and patient/family.  CBC: Recent Labs  Lab 01/14/24 2058 01/15/24 0600 01/18/24 0645  WBC 13.3* 10.6* 8.2  NEUTROABS 9.8*  --   --   HGB 11.3* 11.1* 10.2*  HCT 34.9* 33.9* 30.7*  MCV 92.1 91.1 89.2  PLT 199 194 182   Basic Metabolic Panel: Recent Labs  Lab 01/14/24 2058 01/15/24 0600 01/18/24 0645  NA 140 138 135  K 4.6 4.4 4.7  CL 104 105 102  CO2 23 24 24   GLUCOSE 180* 183* 169*  BUN 23 21 26*  CREATININE 1.26* 1.23 1.28*  CALCIUM 9.0 9.0 9.0    Studies: No results found.  Scheduled Meds:  Chlorhexidine  Gluconate Cloth  6 each Topical Daily   enoxaparin  (LOVENOX ) injection  40 mg Subcutaneous Q24H   feeding supplement  237 mL Oral BID BM   metFORMIN   500 mg Oral BID WC   metoprolol  succinate  25 mg Oral Daily   pantoprazole   40 mg Oral QHS   ramipril   5 mg Oral Daily   senna-docusate  1 tablet Oral BID   simvastatin   40 mg Oral QHS   Continuous Infusions: PRN Meds: acetaminophen ,  hydrALAZINE , lidocaine , magnesium  hydroxide, methocarbamol , ondansetron  **OR** ondansetron  (ZOFRAN ) IV, mouth rinse, traZODone   Time spent: 35 minutes  Author: ELVAN SOR. MD [facility] 01/19/2024 5:31 PM  To reach On-call, see care teams to locate the attending and reach out to them via [email or web address]. If 7PM-7AM, please contact night-coverage If you still have difficulty reaching the attending provider, please page the [facility] (Director on Call) for [facility] on amion for assistance.

## 2024-01-19 NOTE — Progress Notes (Signed)
" °   01/19/24 1500  Spiritual Encounters  Type of Visit Follow up  Care provided to: Pt and family  Reason for visit Advance directives  OnCall Visit No  Interventions  Spiritual Care Interventions Made Compassionate presence  Intervention Outcomes  Outcomes Connection to spiritual care  Spiritual Care Plan  Spiritual Care Issues Still Outstanding Chaplain will continue to follow   Chaplain followed up on Advance Directive. Chaplain was able to help walk family and patient through filling out the Advance Directive. The Advance Directive is now ready for Notary and  two witnesses . "

## 2024-01-19 NOTE — Progress Notes (Signed)
 PT Cancellation Note  Patient Details Name: ISAIA HASSELL MRN: [id number] DOB: 10-13-1923   Cancelled Treatment:    Reason Eval/Treat Not Completed:  (Treatment session attempted. Patient with chaplain, visitor at bedside to address advanced directives/paperwork (?).  Will re-attempt at later time/date as medically appropriate and available.)   Annette Bertelson H. Delores, PT, DPT, NCS 01/19/2024, 3:23 PM [phone]

## 2024-01-19 NOTE — Progress Notes (Signed)
Head CT has been ordered.

## 2024-01-19 NOTE — TOC Progression Note (Signed)
 Transition of Care [facility]) - Progression Note    Patient Details  Name: Francisco Knapp MRN: [id number] Date of Birth: 1923/05/04  [facility] CM/SW Contact  Delphine KANDICE Bring, RN Phone Number: 01/19/2024, 3:00 PM  Clinical Narrative:     CM spoke with patient's son.  He informed me that he had seen [facility],  and [facility]. He states that he has done a full tour at [facility].   Son picked [place]. CM called facility and spoke with Darrin. They will accept patient tomorrow. Cm notified Dr. Von and covering CM.  CM informed son that they will accept patient tomorrow. He states that he want to be with when he goes to the facility. Currently , son states he is in [place] and will call CM when he is on the way back to [place] in the morning. He states that he cannot get his father into his car and asked that transportation be arranged.    Expected Discharge Plan: Skilled Nursing Facility Barriers to Discharge: SNF Pending bed offer               Expected Discharge Plan and Services   Discharge Planning Services: CM Consult Post Acute Care Choice: Skilled Nursing Facility Living arrangements for the past 2 months: Single Family Home Expected Discharge Date: 01/16/24               DME Arranged: N/A                     Social Drivers of Health (SDOH) Interventions SDOH Screenings   Food Insecurity: No Food Insecurity (01/15/2024)  Housing: Low Risk (01/15/2024)  Transportation Needs: No Transportation Needs (01/15/2024)  Utilities: Not At Risk (01/15/2024)  Financial Resource Strain: Low Risk  (09/21/2023)   Received from [facility]  Social Connections: Socially Isolated (01/16/2024)  Tobacco Use: Low Risk (01/14/2024)    Readmission Risk Interventions     No data to display

## 2024-01-19 NOTE — Progress Notes (Signed)
" °   01/19/24 0900  Spiritual Encounters  Type of Visit Follow up  Reason for visit Advance directives  OnCall Visit No  Spiritual Framework  Presenting Themes Meaning/purpose/sources of inspiration  Patient Stress Factors Health changes  Intervention Outcomes  Outcomes Connection to spiritual care  Spiritual Care Plan  Spiritual Care Issues Still Outstanding Chaplain will continue to follow   Chaplain provided a listening ear. Chaplain asked about advance directive and if patient was familiar with document. Patient expressed they could not hear well. So the Chaplain spoke loudly in the ear of patient [facility] Power of [address]. Patient stated please check back in the afternoon when his son would be there.    "

## 2024-01-20 DIAGNOSIS — I6203 Nontraumatic chronic subdural hemorrhage: Secondary | ICD-10-CM | POA: Diagnosis not present

## 2024-01-20 DIAGNOSIS — I6201 Nontraumatic acute subdural hemorrhage: Secondary | ICD-10-CM | POA: Diagnosis not present

## 2024-01-20 LAB — VITAMIN D 25 HYDROXY (VIT D DEFICIENCY, FRACTURES): Vit D, 25-Hydroxy: 16.5 ng/mL — ABNORMAL LOW (ref 30–100)

## 2024-01-20 LAB — VITAMIN B12: Vitamin B-12: 306 pg/mL (ref 180–914)

## 2024-01-20 MED ORDER — VITAMIN D (ERGOCALCIFEROL) 1.25 MG (50000 UNIT) PO CAPS
50000.0000 [IU] | ORAL_CAPSULE | ORAL | Status: DC
  Administered 2024-01-20: 50000 [IU] via ORAL
  Filled 2024-01-20: qty 1

## 2024-01-20 MED ORDER — CYANOCOBALAMIN 1000 MCG PO TABS: 1000.0000 ug | ORAL_TABLET | Freq: Every day | ORAL | Status: DC

## 2024-01-20 MED ORDER — VITAMIN B-12 1000 MCG PO TABS: 1000.0000 ug | ORAL_TABLET | Freq: Every day | ORAL | Status: DC

## 2024-01-20 MED ORDER — VITAMIN D (ERGOCALCIFEROL) 1.25 MG (50000 UNIT) PO CAPS: 50000.0000 [IU] | ORAL_CAPSULE | ORAL | Status: DC

## 2024-01-20 MED ORDER — CYANOCOBALAMIN 1000 MCG/ML IJ SOLN
1000.0000 ug | Freq: Once | INTRAMUSCULAR | Status: AC
  Administered 2024-01-20: 1000 ug via INTRAMUSCULAR
  Filled 2024-01-20: qty 1

## 2024-01-20 NOTE — Progress Notes (Signed)
 Report called to [facility] place for patient going to room 207

## 2024-01-20 NOTE — Progress Notes (Signed)
" °   01/20/24 1200  Spiritual Encounters  Type of Visit Follow up  Care provided to: Pt and family  Reason for visit Advance directives  OnCall Visit Yes  Interventions  Spiritual Care Interventions Made Compassionate presence   Chaplain was able to secure two witnesses and a notary for an advance directive. Patient struggled to hear but very open and willing to complete the process. AD is completed and patient was thankful. "

## 2024-01-20 NOTE — Discharge Summary (Signed)
 [facility] Discharge Summary   Patient: Francisco Knapp FMW:[id number]  PCP: Epifanio Alm SQUIBB, MD  Date of admission: 01/14/2024   Date of discharge:  01/20/2024     Discharge Diagnoses:  Principal Problem:   Acute on chronic intracranial subdural hematoma (HCC) Active Problems:   Essential hypertension   Type 2 diabetes mellitus without complications (HCC)   Dyslipidemia   SDH (subdural hematoma) (HCC)   Admitted From: Home Disposition:  SNF   Recommendations for Outpatient Follow-up:  Follow-up with PCP, patient needs to be seen by an MD in 1 to 2 days. Follow-up with neurosurgery in 1 week for possible repeat CT head Follow up LABS/TEST:  Repeat B12 level and vitamin D level in between 3 to 6 months   Contact information for after-discharge care     Destination     [facility] .   Service: Skilled Nursing Contact information: [address] [place]  [phone]                    Diet recommendation: Cardiac and Carb modified diet  Activity: The patient is advised to gradually reintroduce usual activities, as tolerated  Discharge Condition: stable  Code Status: DNR-limited  History of present illness: As per the H and P dictated on admission.  Hospital Course:  ISIAAH Knapp is a 89 y.o. 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  He states that he has been tripping.  He denied any presyncope or syncope.  Per his son he sits in the chair for a while and has been getting muscle atrophy.  He has been sports coach.  He fell about twice over the last couple weeks with head injury.  He denies any paresthesias or new focal muscle weakness.  No fever or chills.  No nausea or vomiting or abdominal pain.  He  has been having back spasms.  No chest pain or palpitations.  No cough or wheezing or dyspnea.    12/26: to ED. BP was 167/86 with otherwise normal vital signs.  Labs revealed blood glucose of 180 and creatinine 1.26, high-sensitivity troponin T of 57 CBC showed leukocytosis 13.2 with neutrophilia, hemoglobin 11.3 hematocrit 34.9 close to previous levels.  UA with remarkable for 50 glucose and otherwise negative. EKG controlled afib. CT head 19:51 --> (+)acute on subacute L parieto-occipital SDH up to 1/4 cm, no midline shift or mass effect, (+)advanced cerebral atrophy, (+)small R frontotemporal scalp hematoma.  12/27: Repeat CT in 6h per neurosurgery --> (+)enlarging SDH noted but measures at 1.4 cm again... --> per neurosurgery nothing to do at this time. Rib XR (+)rib fx. Pain control 12/28: pending rehab placement   12/29-12/30: Remains stable, pending rehab placement      Assessment and Plan:  Acute on subacute/chronic intracranial subdural hematoma L parietotemporal region S/p Frequent neurochecks, no focal deficit Lovenox  was given, patient was cleared by neurosurgery.  Hold off aspirin  for now until repeat CT head as an outpatient and cleared by neurosurgery.   Follow-up with neurosurgery in 1 week   # Fall and head trauma as above PT/OT done, recommended rehab placement   # R arm abrasion Wound care - vaseline gauze --> telfa pads --> Kerlix --> coban or ACE wrap   # R Rib fractures 5-8 Pt only  wants tylenol  and lidoderm  for pain control  S/p Robaxin  as needed   # Dyslipidemia: statin   # Type 2 diabetes mellitus without complications: on metformin    # Essential hypertension: Resumed metoprolol  and ramipril  home dose Monitor BP and titrate medications accordingly  # Vitamin D deficiency: started vitamin D 50,000 units p.o. weekly, follow with PCP to repeat vitamin D level after 3 to 6 months.  # Vitamin B12 level 306, goal 400, s/p Vitamin B12 1000 mcg IM injection x 1  dose, followed by oral supplement.  Follow-up PCP to repeat vitamin B12 level in between 3 to 6 months.     Body mass index is 24.04 kg/m.  Nutrition Interventions:  - Patient was instructed, not to drive, operate heavy machinery, perform activities at heights, swimming or participation in water activities or provide baby sitting services while on Pain, Sleep and Anxiety Medications; until his outpatient Physician has advised to do so again.  - Also recommended to not to take more than prescribed Pain, Sleep and Anxiety Medications.  Patient was seen by physical therapy, who recommended Therapy, SNF placement, which was arranged. On the day of the discharge the patient's vitals were stable, and no other acute medical condition were reported by patient. the patient was felt safe to be discharge at SNF with Therapy.  Consultants: Neurosurgery Procedures: None  Discharge Exam: General: Appear in no distress, Oral Mucosa Clear, moist. Cardiovascular: S1 and S2 Present, no Murmur, Respiratory: normal respiratory effort, Bilateral Air entry present and no Crackles, no wheezes Abdomen: Bowel Sound present, Soft and no tenderness. Extremities: no Pedal edema, no calf tenderness Neurology: No focal deficits affect appropriate.  Filed Weights   01/14/24 1810 01/15/24 0825  Weight: 78 kg 78.2 kg   Vitals:   01/20/24 0448 01/20/24 0741  BP: 130/67 105/63  Pulse: 83 80  Resp: 14 18  Temp: 98.1 F (36.7 C) 98.6 F (37 C)  SpO2: 98% 99%    DISCHARGE MEDICATION: Allergies as of 01/20/2024   No Known Allergies      Medication List     PAUSE taking these medications    aspirin  81 MG chewable tablet Wait to take this until your doctor or other care provider tells you to start again. Chew 81 mg by mouth daily.       TAKE these medications    cetirizine 10 MG chewable tablet Commonly known as: ZYRTEC Chew 10 mg by mouth daily.   cyanocobalamin 1000 MCG tablet Take 1 tablet  (1,000 mcg total) by mouth daily. Start taking on: January 21, 2024   metFORMIN  500 MG tablet Commonly known as: Glucophage  Take 1 tablet (500 mg total) by mouth 2 (two) times daily with a meal.   metoprolol  succinate 25 MG 24 hr tablet Commonly known as: TOPROL -XL Take 25 mg by mouth daily.   multivitamin with minerals Tabs tablet Take 1 tablet by mouth daily.   ramipril  5 MG capsule Commonly known as: ALTACE  Take 5 mg by mouth daily.   simvastatin  40 MG tablet Commonly known as: ZOCOR  Take 40 mg by mouth Nightly.   Vitamin D (Ergocalciferol) 1.25 MG (50000 UNIT) Caps capsule Commonly known as: DRISDOL Take 1 capsule (50,000 Units total) by mouth every 7 (seven) days.       Allergies[1] Discharge Instructions     Call MD for:  difficulty breathing, headache or visual disturbances   Complete by: As directed    Call MD for:  extreme fatigue   Complete  by: As directed    Call MD for:  persistant dizziness or light-headedness   Complete by: As directed    Call MD for:  persistant nausea and vomiting   Complete by: As directed    Call MD for:  redness, tenderness, or signs of infection (pain, swelling, redness, odor or green/yellow discharge around incision site)   Complete by: As directed    Call MD for:  severe uncontrolled pain   Complete by: As directed    Call MD for:  temperature >100.4   Complete by: As directed    Discharge instructions   Complete by: As directed    Follow-up with PCP, patient needs to be seen by an MD in 1 to 2 days. Follow-up with neurosurgery in 1 week for possible repeat CT head Repeat B12 level and vitamin D level in between 3 to 6 months   Increase activity slowly   Complete by: As directed    No wound care   Complete by: As directed        The results of significant diagnostics from this hospitalization (including imaging, microbiology, ancillary and laboratory) are listed below for reference.    Significant Diagnostic  Studies: DG Ribs Unilateral Right Result Date: 01/15/2024 CLINICAL DATA:  Rib pain after fall. EXAM: RIGHT RIBS - 2 VIEW COMPARISON:  None Available. FINDINGS: Minimally displaced fractures of right lateral fifth through seventh ribs. No pneumothorax or large pleural effusion. The right lung is clear. IMPRESSION: Minimally displaced fractures of right lateral fifth through seventh ribs. No pneumothorax. Electronically Signed   By: Andrea Gasman M.D.   On: 01/15/2024 17:07   CT HEAD WO CONTRAST ( ) Result Date: 01/15/2024 EXAM: CT HEAD WITHOUT CONTRAST 01/15/2024 08:15:00 AM TECHNIQUE: CT of the head was performed without the administration of intravenous contrast. Automated exposure control, iterative reconstruction, and/or weight based adjustment of the mA/kV was utilized to reduce the radiation dose to as low as reasonably achievable. COMPARISON: Head CT 01/15/2024 01:57:00 AM and earlier. CLINICAL HISTORY: 89 year old male. Follow subdural hematoma - serial exam. Status post fall. FINDINGS: BRAIN AND VENTRICLES: Left hemisphere mixed density subdural hematoma measures up to 7 mm in thickness, stable. Small contralateral isodense right side subdural hematoma measures 2 to 3 mm in thickness, mildly redistributed but otherwise stable since initial presentation. Small volume of hyperdense blood redistributed along the tentorium is incidentally noted. Trace rightward midline shift is stable (coronal image 20). Stable mild mass effect on the left lateral ventricle with no ventriculomegaly. Basilar cisterns remain patent. Calcified atherosclerosis at the skull base. No suspicious intracranial vascular hyperdensity. Stable gray white differentiation. No evidence of acute infarct. No hydrocephalus. ORBITS: No acute abnormality. SINUSES: No acute abnormality. SOFT TISSUES AND SKULL: Left anterior convexity broad based scalp hematoma appears stable on series 3 image 48. No skull fracture. IMPRESSION: 1. Stable  left subdural hematoma (7 mm). Stable smaller right subdural hematoma (2-3 mm). Stable trace rightward midline shift. 2. No new intracranial abnormality. Electronically signed by: Helayne Hurst MD 01/15/2024 08:30 AM EST RP Workstation: [id number]   CT Head Wo Contrast Result Date: 01/15/2024 EXAM: CT HEAD WITHOUT CONTRAST 01/15/2024 02:05:47 AM TECHNIQUE: CT of the head was performed without the administration of intravenous contrast. Automated exposure control, iterative reconstruction, and/or weight based adjustment of the mA/kV was utilized to reduce the radiation dose to as low as reasonably achievable. COMPARISON: 01/14/2024 CLINICAL HISTORY: fall, SDH evaluation FINDINGS: BRAIN AND VENTRICLES: Acute subdural hematoma along left cerebral convexity measuring up to  1.4 cm, increased from prior 1.1 cm, with hyperdense blood now overlying the entire left convexity. Associated increasing mass effect with effacement of the left lateral ventricle, sulcal crowding and effacement of left parietal and occipital sulci. No midline shift. New small extension of hemorrhage along left tentorium. Left posterior fossa arachnoid cyst. Mild cerebral atrophy. Patchy periventricular white matter hypodensities consistent with chronic microvascular ischemic disease. No evidence of acute infarct. No hydrocephalus. No other extra-axial collection. ORBITS: Bilateral lens replacement noted. SINUSES: No acute abnormality. SOFT TISSUES AND SKULL: Right frontal scalp contusion. No skull fracture. VASCULATURE: Moderate atherosclerotic calcification of carotid siphons and vertebral arteries. IMPRESSION: 1. Acute , enlarging left cerebral convexity subdural hematoma, increased to 1.4 cm, with increasing mass effect and new small extension along the left tentorium, without midline shift. 2. Right frontal scalp contusion. 3. Mild cerebral atrophy, chronic microvascular ischemic disease, left posterior fossa arachnoid cyst, moderate  intracranial atherosclerotic calcification, and bilateral lens replacements. 4. These results will be called to the ordering clinician or representative by the radiologist assistant, and communication documented in the pacs or clario dashboard. Electronically signed by: Dorethia Molt MD 01/15/2024 02:15 AM EST RP Workstation: [id number]   CT Head Wo Contrast Result Date: 01/14/2024 EXAM: CT HEAD AND CERVICAL SPINE 01/14/2024 07:51:34 PM TECHNIQUE: CT of the head and cervical spine was performed without the administration of intravenous contrast. Multiplanar reformatted images are provided for review. Automated exposure control, iterative reconstruction, and/or weight based adjustment of the mA/kV was utilized to reduce the radiation dose to as low as reasonably achievable. COMPARISON: Head CT and MRI brain, both dated 02/05/2022. CTA head and neck and reconstructions also from 1/18/. 24. CLINICAL HISTORY: Fall. FINDINGS: CT HEAD BRAIN AND VENTRICLES: There is an acute on subacute left hemispheric subdural hematoma. Hyperdense blood products measuring up to 1.4 cm in thickness on coronal reconstruction series 4, image 55 are noted along the posterolateral left parieto-occipital convexity merging with isodense blood products measuring 4 mm in thickness in the upper left insular area as measured on image 20 of axial series 2. There is local adjacent crowding of sulci, but no midline shift or downward mass effect due to preexisting atrophy. No further intracranial hemorrhage is seen. . Again noted is moderately advanced cerebral atrophy, small vessel disease, and atrophic ventriculomegaly with relatively mild cerebellar atrophy. There is a 2.2 cm arachnoid cyst again seen laterally in the left posterior fossa without positive mass effect. There are small chronic lacunar infarcts in the left frontal deep white matter. No other old infarct is evident. No cortical-based acute infarct is seen. The carotid siphons and  both distal vertebral arteries are heavily calcified with patchy basilar artery calcific plaques. No hyperdense vessel is seen. ORBITS: Old lens extractions with otherwise negative orbits. SINUSES AND MASTOIDS: No acute abnormality. SOFT TISSUES AND SKULL: Small right frontolateral scalp hematoma. No evidence of depressed skull fractures. CT CERVICAL SPINE BONES AND ALIGNMENT: Cervical spine demonstrates osteopenia without evidence of fractures or focal pathologic bone lesion. There is bone-on-bone anterior atlanto-dental joint space loss with osteophytes and degenerative cystic changes in the dens with retroodontoid pannus. Reversed cervical lordosis is again noted with minimal grade 1 degenerative anterolisthesis at the levels C2-C3, C3-C4, and C7-T1. No acute fracture or traumatic malalignment. DEGENERATIVE CHANGES: The cervical discs are chronically collapsed with bidirectional osteophytes at C4-C5, C5-C6, and C6-C7. At C4-C5 and C5-C6, this is associated with spinal canal stenosis and mild to moderate spondylotic cord compression due to posterior osteophytes, unchanged .  Other levels do not show significant cord encroachment. The discs are otherwise normal in height. Moderate facet joint and uncinate hypertrophy from C2-C3 through C5-C6, with bilateral acquired foraminal stenosis most severe at C3-C4, C4-C5 and C5-C6. SOFT TISSUES: No prevertebral soft tissue swelling. No spinal canal hematoma. There is a stable 1.4 cm heterogeneous nodule in the right lobe of the thyroid gland. There is no laryngeal mass. . There are heavy calcifications in the proximal cervical ICAs (internal carotid arteries). There are no acute upper thoracic findings. IMPRESSION: 1. Acute on subacute left parieto-occipital subdural hematoma up to 1.4 cm thickness with local adjacent crowding of sulci, but no midline shift or downward mass effect. 2. Moderately advanced cerebral atrophy and small vessel disease. Chronic arachnoid cyst left  posterior fossa. 3. Small right frontolateral scalp hematoma without depressed skull fracture. 4. No acute fracture or traumatic malalignment of the cervical spine. 5. Degenerative cervical spondylosis with spinal canal stenosis and mild to moderate spondylotic cord compression at C4-5 and C5-6. 6. osteopenia and multilevel acquired foraminal stenosis. 7. Heavy Vascular calcifications. 8. Results discussed over the telephone with Dr. Floy at 8:27 pm, 01/14/24 with verbal acknowledgment of findings. Electronically signed by: Francis Quam MD 01/14/2024 08:32 PM EST RP Workstation: [id number]   CT Cervical Spine Wo Contrast Result Date: 01/14/2024 EXAM: CT HEAD AND CERVICAL SPINE 01/14/2024 07:51:34 PM TECHNIQUE: CT of the head and cervical spine was performed without the administration of intravenous contrast. Multiplanar reformatted images are provided for review. Automated exposure control, iterative reconstruction, and/or weight based adjustment of the mA/kV was utilized to reduce the radiation dose to as low as reasonably achievable. COMPARISON: Head CT and MRI brain, both dated 02/05/2022. CTA head and neck and reconstructions also from 1/18/. 24. CLINICAL HISTORY: Fall. FINDINGS: CT HEAD BRAIN AND VENTRICLES: There is an acute on subacute left hemispheric subdural hematoma. Hyperdense blood products measuring up to 1.4 cm in thickness on coronal reconstruction series 4, image 55 are noted along the posterolateral left parieto-occipital convexity merging with isodense blood products measuring 4 mm in thickness in the upper left insular area as measured on image 20 of axial series 2. There is local adjacent crowding of sulci, but no midline shift or downward mass effect due to preexisting atrophy. No further intracranial hemorrhage is seen. . Again noted is moderately advanced cerebral atrophy, small vessel disease, and atrophic ventriculomegaly with relatively mild cerebellar atrophy. There is a 2.2 cm  arachnoid cyst again seen laterally in the left posterior fossa without positive mass effect. There are small chronic lacunar infarcts in the left frontal deep white matter. No other old infarct is evident. No cortical-based acute infarct is seen. The carotid siphons and both distal vertebral arteries are heavily calcified with patchy basilar artery calcific plaques. No hyperdense vessel is seen. ORBITS: Old lens extractions with otherwise negative orbits. SINUSES AND MASTOIDS: No acute abnormality. SOFT TISSUES AND SKULL: Small right frontolateral scalp hematoma. No evidence of depressed skull fractures. CT CERVICAL SPINE BONES AND ALIGNMENT: Cervical spine demonstrates osteopenia without evidence of fractures or focal pathologic bone lesion. There is bone-on-bone anterior atlanto-dental joint space loss with osteophytes and degenerative cystic changes in the dens with retroodontoid pannus. Reversed cervical lordosis is again noted with minimal grade 1 degenerative anterolisthesis at the levels C2-C3, C3-C4, and C7-T1. No acute fracture or traumatic malalignment. DEGENERATIVE CHANGES: The cervical discs are chronically collapsed with bidirectional osteophytes at C4-C5, C5-C6, and C6-C7. At C4-C5 and C5-C6, this is associated with spinal canal  stenosis and mild to moderate spondylotic cord compression due to posterior osteophytes, unchanged . Other levels do not show significant cord encroachment. The discs are otherwise normal in height. Moderate facet joint and uncinate hypertrophy from C2-C3 through C5-C6, with bilateral acquired foraminal stenosis most severe at C3-C4, C4-C5 and C5-C6. SOFT TISSUES: No prevertebral soft tissue swelling. No spinal canal hematoma. There is a stable 1.4 cm heterogeneous nodule in the right lobe of the thyroid gland. There is no laryngeal mass. . There are heavy calcifications in the proximal cervical ICAs (internal carotid arteries). There are no acute upper thoracic findings.  IMPRESSION: 1. Acute on subacute left parieto-occipital subdural hematoma up to 1.4 cm thickness with local adjacent crowding of sulci, but no midline shift or downward mass effect. 2. Moderately advanced cerebral atrophy and small vessel disease. Chronic arachnoid cyst left posterior fossa. 3. Small right frontolateral scalp hematoma without depressed skull fracture. 4. No acute fracture or traumatic malalignment of the cervical spine. 5. Degenerative cervical spondylosis with spinal canal stenosis and mild to moderate spondylotic cord compression at C4-5 and C5-6. 6. osteopenia and multilevel acquired foraminal stenosis. 7. Heavy Vascular calcifications. 8. Results discussed over the telephone with Dr. Floy at 8:27 pm, 01/14/24 with verbal acknowledgment of findings. Electronically signed by: Francis Quam MD 01/14/2024 08:32 PM EST RP Workstation: [id number]    Microbiology: Recent Results (from the past 240 hours)  MRSA Next Gen by PCR, Nasal     Status: None   Collection Time: 01/15/24  8:32 AM   Specimen: Nasal Mucosa; Nasal Swab  Result Value Ref Range Status   MRSA by PCR Next Gen NOT DETECTED NOT DETECTED Final    Comment: (NOTE) The GeneXpert MRSA Assay (FDA approved for NASAL specimens only), is one component of a comprehensive MRSA colonization surveillance program. It is not intended to diagnose MRSA infection nor to guide or monitor treatment for MRSA infections. Test performance is not FDA approved in patients less than 77 years old. Performed at [facility], [address] [place], [place] [address]      Labs: CBC: Recent Labs  Lab 01/14/24 2058 01/15/24 0600 01/18/24 0645  WBC 13.3* 10.6* 8.2  NEUTROABS 9.8*  --   --   HGB 11.3* 11.1* 10.2*  HCT 34.9* 33.9* 30.7*  MCV 92.1 91.1 89.2  PLT 199 194 182   Basic Metabolic Panel: Recent Labs  Lab 01/14/24 2058 01/15/24 0600 01/18/24 0645  NA 140 138 135  K 4.6 4.4 4.7  CL 104 105 102  CO2 23 24 24    GLUCOSE 180* 183* 169*  BUN 23 21 26*  CREATININE 1.26* 1.23 1.28*  CALCIUM 9.0 9.0 9.0   Liver Function Tests: No results for input(s): AST, ALT, ALKPHOS, BILITOT, PROT, ALBUMIN in the last 168 hours. No results for input(s): LIPASE, AMYLASE in the last 168 hours. No results for input(s): AMMONIA in the last 168 hours. Cardiac Enzymes: No results for input(s): CKTOTAL, CKMB, CKMBINDEX, TROPONINI in the last 168 hours. BNP (last 3 results) No results for input(s): BNP in the last 8760 hours. CBG: Recent Labs  Lab 01/17/24 0058 01/17/24 0434 01/17/24 0739 01/17/24 1144 01/19/24 0831  GLUCAP 93 131* 116* 281* 154*    Time spent: 35 minutes  Signed:  Elvan Sor  [facility]  01/20/2024 11:07 AM      [1] No Known Allergies

## 2024-01-20 NOTE — Plan of Care (Signed)
" °  Problem: Education: Goal: Knowledge of disease or condition will improve Outcome: Progressing Goal: Knowledge of secondary prevention will improve (MUST DOCUMENT ALL) Outcome: Progressing Goal: Knowledge of patient specific risk factors will improve (DELETE if not current risk factor) Outcome: Progressing   Problem: Coping: Goal: Will verbalize positive feelings about self Outcome: Progressing Goal: Will identify appropriate support needs Outcome: Progressing   Problem: Health Behavior/Discharge Planning: Goal: Ability to manage health-related needs will improve Outcome: Progressing Goal: Goals will be collaboratively established with patient/family Outcome: Progressing   Problem: Self-Care: Goal: Ability to participate in self-care as condition permits will improve Outcome: Progressing Goal: Verbalization of feelings and concerns over difficulty with self-care will improve Outcome: Progressing Goal: Ability to communicate needs accurately will improve Outcome: Progressing   Problem: Nutrition: Goal: Risk of aspiration will decrease Outcome: Progressing Goal: Dietary intake will improve Outcome: Progressing   Problem: Education: Goal: Ability to describe self-care measures that may prevent or decrease complications (Diabetes Survival Skills Education) will improve Outcome: Progressing Goal: Individualized Educational Video(s) Outcome: Progressing   Problem: Coping: Goal: Ability to adjust to condition or change in health will improve Outcome: Progressing   Problem: Fluid Volume: Goal: Ability to maintain a balanced intake and output will improve Outcome: Progressing   Problem: Health Behavior/Discharge Planning: Goal: Ability to identify and utilize available resources and services will improve Outcome: Progressing Goal: Ability to manage health-related needs will improve Outcome: Progressing   Problem: Metabolic: Goal: Ability to maintain appropriate glucose  levels will improve Outcome: Progressing   Problem: Nutritional: Goal: Maintenance of adequate nutrition will improve Outcome: Progressing Goal: Progress toward achieving an optimal weight will improve Outcome: Progressing   Problem: Skin Integrity: Goal: Risk for impaired skin integrity will decrease Outcome: Progressing   Problem: Tissue Perfusion: Goal: Adequacy of tissue perfusion will improve Outcome: Progressing   Problem: Education: Goal: Knowledge of General Education information will improve Description: Including pain rating scale, medication(s)/side effects and non-pharmacologic comfort measures Outcome: Progressing   Problem: Health Behavior/Discharge Planning: Goal: Ability to manage health-related needs will improve Outcome: Progressing   Problem: Clinical Measurements: Goal: Ability to maintain clinical measurements within normal limits will improve Outcome: Progressing Goal: Will remain free from infection Outcome: Progressing Goal: Diagnostic test results will improve Outcome: Progressing Goal: Respiratory complications will improve Outcome: Progressing Goal: Cardiovascular complication will be avoided Outcome: Progressing   Problem: Activity: Goal: Risk for activity intolerance will decrease Outcome: Progressing   Problem: Nutrition: Goal: Adequate nutrition will be maintained Outcome: Progressing   Problem: Coping: Goal: Level of anxiety will decrease Outcome: Progressing   Problem: Elimination: Goal: Will not experience complications related to bowel motility Outcome: Progressing Goal: Will not experience complications related to urinary retention Outcome: Progressing   Problem: Pain Managment: Goal: General experience of comfort will improve and/or be controlled Outcome: Progressing   Problem: Safety: Goal: Ability to remain free from injury will improve Outcome: Progressing   Problem: Skin Integrity: Goal: Risk for impaired skin  integrity will decrease Outcome: Progressing   "

## 2024-01-21 NOTE — Telephone Encounter (Signed)
 Order was placed and scan to be done around 01/31/2023. Please schedule follow up after that date

## 2024-01-25 ENCOUNTER — Telehealth: Payer: Self-pay | Admitting: Orthopedic Surgery

## 2024-01-25 NOTE — Telephone Encounter (Signed)
 Per chart CT scan appointment was moved up to 01/31/24

## 2024-01-25 NOTE — Telephone Encounter (Signed)
 CT was to be done around 1/12. If it can be moved closer to 1/12 that's okay, but if not then can leave as scheduled.

## 2024-01-25 NOTE — Telephone Encounter (Signed)
 Diane from [place] [phone]) called to confirm the appointment. The appointment is scheduled, and the CT scan is set for 01/28. I advised that this may need to be moved up. Is it okay to keep the CT on 01/28 and schedule the follow-up for 02/04?

## 2024-01-25 NOTE — Telephone Encounter (Signed)
 noted

## 2024-01-25 NOTE — Telephone Encounter (Signed)
 Will review CT once done and move up follow up if needed.

## 2024-01-31 ENCOUNTER — Ambulatory Visit: Payer: PRIVATE HEALTH INSURANCE

## 2024-02-04 ENCOUNTER — Ambulatory Visit: Payer: PRIVATE HEALTH INSURANCE

## 2024-02-16 ENCOUNTER — Ambulatory Visit: Payer: PRIVATE HEALTH INSURANCE

## 2024-02-20 DEATH — deceased

## 2024-02-23 ENCOUNTER — Ambulatory Visit: Payer: PRIVATE HEALTH INSURANCE | Admitting: Orthopedic Surgery
